# Patient Record
Sex: Female | Born: 1990 | Race: Black or African American | Hispanic: No | Marital: Single | Smoking: Never smoker
Health system: Southern US, Community
[De-identification: ages and names within clinical notes are randomized; demographics above are authoritative.]

## PROBLEM LIST (undated history)

## (undated) DIAGNOSIS — E611 Iron deficiency: Secondary | ICD-10-CM

## (undated) DIAGNOSIS — I10 Essential (primary) hypertension: Secondary | ICD-10-CM

## (undated) DIAGNOSIS — F419 Anxiety disorder, unspecified: Secondary | ICD-10-CM

## (undated) HISTORY — PX: WISDOM TOOTH EXTRACTION: SHX21

## (undated) HISTORY — PX: COSMETIC SURGERY: SHX468

---
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
## 2015-07-12 ENCOUNTER — Encounter (HOSPITAL_COMMUNITY): Payer: Self-pay

## 2015-07-12 ENCOUNTER — Emergency Department (HOSPITAL_COMMUNITY)
Admission: EM | Admit: 2015-07-12 | Discharge: 2015-07-12 | Disposition: A | Discharge status: Home / Self Care | Payer: Medicaid Other | Attending: Emergency Medicine | Admitting: Emergency Medicine

## 2015-07-12 DIAGNOSIS — F419 Anxiety disorder, unspecified: Secondary | ICD-10-CM | POA: Insufficient documentation

## 2015-07-12 DIAGNOSIS — T7840XA Allergy, unspecified, initial encounter: Secondary | ICD-10-CM | POA: Insufficient documentation

## 2015-07-12 DIAGNOSIS — X58XXXA Exposure to other specified factors, initial encounter: Secondary | ICD-10-CM | POA: Insufficient documentation

## 2015-07-12 DIAGNOSIS — Y999 Unspecified external cause status: Secondary | ICD-10-CM | POA: Insufficient documentation

## 2015-07-12 DIAGNOSIS — Y939 Activity, unspecified: Secondary | ICD-10-CM | POA: Diagnosis not present

## 2015-07-12 DIAGNOSIS — R0602 Shortness of breath: Secondary | ICD-10-CM | POA: Insufficient documentation

## 2015-07-12 DIAGNOSIS — Y929 Unspecified place or not applicable: Secondary | ICD-10-CM | POA: Insufficient documentation

## 2015-07-12 MED ORDER — EPINEPHRINE 0.3 MG/0.3ML IJ SOAJ: 0.3000 mg | Freq: Once | INTRAMUSCULAR | Status: DC

## 2015-07-12 MED ORDER — EPINEPHRINE 0.3 MG/0.3ML IJ SOAJ
0.3000 mg | Freq: Once | INTRAMUSCULAR | Status: AC
  Administered 2015-07-12: 0.3 mg via INTRAMUSCULAR
  Filled 2015-07-12: qty 0.3

## 2015-07-12 MED ORDER — PREDNISONE 50 MG PO TABS
60.0000 mg | ORAL_TABLET | Freq: Once | ORAL | Status: AC
  Administered 2015-07-12: 60 mg via ORAL
  Filled 2015-07-12 (×2): qty 1

## 2015-07-12 NOTE — ED Notes (Signed)
Patient has known shellfish allergy with airway swelling. Tonight while I work patient came into contact (touch only) with shellfish. Normally patient would use an EPI pen however she did not have one tonight. Pt c/o of difficulty breathing and generalized itching.

## 2015-07-12 NOTE — ED Provider Notes (Signed)
CSN: [id number]     Arrival date & time 07/12/15  1958 History  This chart was scribed for Benjiman Core, MD by Phillis Haggis, ED Scribe. This patient was seen in room [facility]08/[facility]08 and patient care was started at 8:16 PM.    Chief Complaint  Patient presents with  . Allergic Reaction   The history is provided by the patient. No language interpreter was used.   HPI Comments: Michele Richardson is a 24 y.o. Female with hx of shellfish allergies who presents to the Emergency Department complaining of generalized itching and difficulty breathing onset 3 hours ago. Pt states that she was at work when she came into contact with shellfish, which she has a known allergy to and typically causes airway swelling and rash. She states that her throat feels tight and her tongue feels itchy. Reports taking benadryl to no relief. Pt states that she usually has an EpiPen with her but did not have one on her.  Denies any significant medical problems. Denies sneezing, cough, color change, or any other symptoms.   No past medical history on file. No past surgical history on file. No family history on file. Social History  Substance Use Topics  . Smoking status: Never Smoker   . Smokeless tobacco: Not on file  . Alcohol Use: No   OB History    No data available     Review of Systems  HENT: Negative for sneezing.        Throat swelling  Respiratory: Positive for shortness of breath (difficulty breathing). Negative for cough.   Skin: Positive for rash. Negative for color change.  Allergic/Immunologic: Positive for food allergies (shellfish).   Allergies  Shellfish allergy and Sulfa antibiotics  Home Medications   Prior to Admission medications   Medication Sig Start Date End Date Taking? Authorizing Provider  diphenhydrAMINE (BENADRYL) 25 MG tablet Take 25 mg by mouth once as needed for allergies.   Yes Historical Provider, MD  EPINEPHrine 0.3 mg/0.3 mL IJ SOAJ injection Inject 0.3 mLs (0.3 mg  total) into the muscle once. 07/12/15   Benjiman Core, MD   BP 140/89 mmHg  Pulse 74  Temp(Src) 98.2 F (36.8 C) (Oral)  Resp 20  Ht  (1.702 m)  Wt 170 lb (77.111 kg)  BMI 26.62 kg/m2  SpO2 100%  LMP 07/01/2015  Physical Exam  Constitutional: She is oriented to person, place, and time. She appears well-developed and well-nourished.  Slightly anxious  HENT:  Head: Normocephalic and atraumatic.  Mild posterior pharyngeal edema; no lip swelling  Eyes: EOM are normal. Pupils are equal, round, and reactive to light.  Neck: Normal range of motion. Neck supple.  Cardiovascular: Normal rate, regular rhythm and normal heart sounds.   Pulmonary/Chest: Effort normal. No stridor. She has no wheezes.  Mildly harsh breath sounds  Abdominal: Soft. There is no tenderness.  Musculoskeletal: Normal range of motion.  No peripheral edema  Neurological: She is alert and oriented to person, place, and time.  Skin: Skin is warm and dry.  Psychiatric: She has a normal mood and affect. Her behavior is normal.  Nursing note and vitals reviewed.   ED Course  Procedures (including critical care time) COORDINATION OF CARE: 8:18 PM-Discussed treatment plan which includes oral steroids and EpiPen prescription with pt at bedside and pt agreed to plan.   No results found for this or any previous visit. No results found.   Labs Review Labs Reviewed - No data to display  Imaging Review  No results found.    EKG Interpretation None      MDM   Final diagnoses:  Allergic reaction, initial encounter    Patient with possible allergic reaction to shellfish. Feels better after treatment. Will discharge home with EpiPen. I personally performed the services described in this documentation, which was scribed in my presence. The recorded information has been reviewed and is accurate.     Benjiman Core, MD 07/13/15 2352

## 2015-07-12 NOTE — Discharge Instructions (Signed)

## 2016-12-14 DIAGNOSIS — Z79899 Other long term (current) drug therapy: Secondary | ICD-10-CM | POA: Insufficient documentation

## 2016-12-14 DIAGNOSIS — Z202 Contact with and (suspected) exposure to infections with a predominantly sexual mode of transmission: Secondary | ICD-10-CM | POA: Insufficient documentation

## 2016-12-15 ENCOUNTER — Emergency Department (HOSPITAL_COMMUNITY)
Admission: EM | Admit: 2016-12-15 | Discharge: 2016-12-15 | Disposition: A | Discharge status: Home / Self Care | Payer: Medicaid Other | Attending: Emergency Medicine | Admitting: Emergency Medicine

## 2016-12-15 ENCOUNTER — Encounter (HOSPITAL_COMMUNITY): Payer: Self-pay | Admitting: Emergency Medicine

## 2016-12-15 DIAGNOSIS — Z202 Contact with and (suspected) exposure to infections with a predominantly sexual mode of transmission: Secondary | ICD-10-CM

## 2016-12-15 MED ORDER — CEFTRIAXONE SODIUM 250 MG IJ SOLR
250.0000 mg | Freq: Once | INTRAMUSCULAR | Status: AC
  Administered 2016-12-15: 250 mg via INTRAMUSCULAR
  Filled 2016-12-15: qty 250

## 2016-12-15 MED ORDER — AZITHROMYCIN 250 MG PO TABS
1000.0000 mg | ORAL_TABLET | Freq: Once | ORAL | Status: AC
  Administered 2016-12-15: 1000 mg via ORAL
  Filled 2016-12-15: qty 4

## 2016-12-15 NOTE — Discharge Instructions (Signed)
Follow-up with the health dept if needed °

## 2016-12-15 NOTE — ED Triage Notes (Signed)
Pt states partner has yellow penile dc that started today. Pt states she has had no sx. Pt denies any vag dc, gu sx or abd pain. Denies any vaginal issues. Nad.

## 2016-12-15 NOTE — ED Provider Notes (Signed)
[facility]-EMERGENCY DEPT Provider Note   CSN: [id number] Arrival date & time: 12/14/16  2338     History   Chief Complaint Chief Complaint  Patient presents with  . Follow-up    HPI Michele Richardson is a 26 y.o. female.  HPI   Michele Richardson is a 26 y.o. female who presents to the Emergency Department requesting evaluation for possible STD.  She states her boyfriend noticed a yellow discharge from his penis two hours prior to arrival and he came to ER for evaluation and she decided to come here as well.  Patient denies any symptoms at present.  She reports last sexual encounter was several hours prior to arrival.   History reviewed. No pertinent past medical history.  There are no active problems to display for this patient.   History reviewed. No pertinent surgical history.  OB History    Gravida Para Term Preterm AB Living   1 1       1    SAB TAB Ectopic Multiple Live Births                   Home Medications    Prior to Admission medications   Medication Sig Start Date End Date Taking? Authorizing Provider  diphenhydrAMINE (BENADRYL) 25 MG tablet Take 25 mg by mouth once as needed for allergies.    Historical Provider, MD  EPINEPHrine 0.3 mg/0.3 mL IJ SOAJ injection Inject 0.3 mLs (0.3 mg total) into the muscle once. 07/12/15   Benjiman CoreNathan Pickering, MD    Family History History reviewed. No pertinent family history.  Social History Social History  Substance Use Topics  . Smoking status: Never Smoker  . Smokeless tobacco: Never Used  . Alcohol use Yes     Comment: every other day(one beer)     Allergies   Shellfish allergy and Sulfa antibiotics   Review of Systems Review of Systems  Constitutional: Negative for appetite change, chills and fever.  Respiratory: Negative for shortness of breath.   Cardiovascular: Negative for chest pain.  Gastrointestinal: Negative for abdominal pain, nausea and vomiting.  Genitourinary: Negative for decreased urine  volume, difficulty urinating, dysuria, frequency, genital sores, hematuria, vaginal bleeding, vaginal discharge and vaginal pain.  Musculoskeletal: Negative for back pain and myalgias.  Skin: Negative for rash.     Physical Exam Updated Vital Signs BP 142/85 (BP Location: Right Arm)   Pulse 70   Temp 98.2 F (36.8 C) (Oral)   Resp 17   Ht 5\' 7"  (1.702 m)   Wt 88.5 kg   LMP 11/22/2016   SpO2 100%   BMI 30.54 kg/m   Physical Exam  Constitutional: She is oriented to person, place, and time. She appears well-nourished. No distress.  HENT:  Head: Atraumatic.  Mouth/Throat: Oropharynx is clear and moist.  Neck: Normal range of motion.  Cardiovascular: Normal rate and regular rhythm.   No murmur heard. Pulmonary/Chest: Effort normal and breath sounds normal. No respiratory distress.  Abdominal: Soft. She exhibits no distension. There is no tenderness. There is no guarding.  Musculoskeletal: Normal range of motion.  Lymphadenopathy:    She has no cervical adenopathy.  Neurological: She is alert and oriented to person, place, and time.  Skin: Skin is warm. No rash noted.  Nursing note and vitals reviewed.    ED Treatments / Results  Labs (all labs ordered are listed, but only abnormal results are displayed) Labs Reviewed - No data to display  EKG  EKG Interpretation None  Radiology No results found.  Procedures Procedures (including critical care time)  Medications Ordered in ED Medications  cefTRIAXone (ROCEPHIN) injection 250 mg (not administered)  azithromycin (ZITHROMAX) tablet 1,000 mg (not administered)     Initial Impression / Assessment and Plan / ED Course  I have reviewed the triage vital signs and the nursing notes.  Pertinent labs & imaging results that were available during my care of the patient were reviewed by me and considered in my medical decision making (see chart for details).     Pelvic exam deferred, pt is asymptomatic and  sexual partner also here and seen by me. He has obvious penile discharge on exam and likely STD, so both patients will be treated.     Pt agrees to IM Rocephin and po zithromax. Counseled on safe sex practices.  Appears stable for d/c  Final Clinical Impressions(s) / ED Diagnoses   Final diagnoses:  STD exposure    New Prescriptions New Prescriptions   No medications on file     Pauline Aus, PA-C 12/15/16 1131    Devoria Albe, MD 12/15/16 2302

## 2017-02-12 ENCOUNTER — Emergency Department (HOSPITAL_COMMUNITY)
Admission: EM | Admit: 2017-02-12 | Discharge: 2017-02-12 | Disposition: A | Discharge status: Home / Self Care | Payer: Medicaid Other | Attending: Emergency Medicine | Admitting: Emergency Medicine

## 2017-02-12 ENCOUNTER — Emergency Department (HOSPITAL_COMMUNITY): Payer: Medicaid Other

## 2017-02-12 ENCOUNTER — Encounter (HOSPITAL_COMMUNITY): Payer: Self-pay | Admitting: Emergency Medicine

## 2017-02-12 DIAGNOSIS — R0602 Shortness of breath: Secondary | ICD-10-CM | POA: Diagnosis present

## 2017-02-12 DIAGNOSIS — J40 Bronchitis, not specified as acute or chronic: Secondary | ICD-10-CM | POA: Insufficient documentation

## 2017-02-12 MED ORDER — AZITHROMYCIN 250 MG PO TABS: ORAL_TABLET | ORAL | 0 refills | Status: DC

## 2017-02-12 MED ORDER — ALBUTEROL SULFATE HFA 108 (90 BASE) MCG/ACT IN AERS
2.0000 | INHALATION_SPRAY | RESPIRATORY_TRACT | Status: DC | PRN
  Administered 2017-02-12: 2 via RESPIRATORY_TRACT
  Filled 2017-02-12: qty 6.7

## 2017-02-12 MED ORDER — ALBUTEROL SULFATE (2.5 MG/3ML) 0.083% IN NEBU
2.5000 mg | INHALATION_SOLUTION | Freq: Once | RESPIRATORY_TRACT | Status: AC
  Administered 2017-02-12: 2.5 mg via RESPIRATORY_TRACT
  Filled 2017-02-12: qty 3

## 2017-02-12 MED ORDER — IPRATROPIUM-ALBUTEROL 0.5-2.5 (3) MG/3ML IN SOLN
3.0000 mL | Freq: Once | RESPIRATORY_TRACT | Status: AC
  Administered 2017-02-12: 3 mL via RESPIRATORY_TRACT
  Filled 2017-02-12: qty 3

## 2017-02-12 MED ORDER — AZITHROMYCIN 250 MG PO TABS: 250.0000 mg | ORAL_TABLET | Freq: Every day | ORAL | 0 refills | Status: DC

## 2017-02-12 MED ORDER — AZITHROMYCIN 250 MG PO TABS
500.0000 mg | ORAL_TABLET | Freq: Once | ORAL | Status: AC
  Administered 2017-02-12: 500 mg via ORAL
  Filled 2017-02-12: qty 2

## 2017-02-12 NOTE — ED Triage Notes (Signed)
SOB started yesterday, productive cough with green sputum. Denies CP, fever, N/V. States she feels SOB with exertion.

## 2017-02-12 NOTE — Discharge Instructions (Signed)
Drink plenty of fluids. Take Tylenol or Motrin for fever or aches. Follow-up if not improving

## 2017-02-12 NOTE — ED Provider Notes (Signed)
[facility]-EM[facility]Y [facility]PT Provider Note   CSN: [id number] Arrival date & time: 02/12/17  1953   By signing my name below, I, Bobbie Stack, attest that this documentation has been prepared under the direction and in the presence of Bethann Berkshire, MD. Electronically Signed: Bobbie Stack, Scribe. 02/12/17. 8:10 PM. History   Chief Complaint Chief Complaint  Patient presents with  . Shortness of Breath    HPI Comments: Michele Richardson is a 26 y.o. female who presents to the Emergency Department complaining of SOB since yesterday. The patient reports a productive cough with green sputum that began yesterday. The patient states that she has chest pain when taking deeps breaths but it only occurs while standing. She denies fever, chills, and swelling in legs. She doesn't report taking anything for her symptoms. She denies hx of smoking.  The history is provided by the patient. No language interpreter was used.  Shortness of Breath  This is a new problem. The average episode lasts 1 day. The problem occurs continuously.The current episode started yesterday. The problem has been gradually worsening. Associated symptoms include cough and sputum production. Pertinent negatives include no fever, no headaches, no vomiting, no abdominal pain, no rash and no leg swelling.    History reviewed. No pertinent past medical history.  There are no active problems to display for this patient.   History reviewed. No pertinent surgical history.  OB History    Gravida Para Term Preterm AB Living   1 1       1    SAB TAB Ectopic Multiple Live Births                   Home Medications    Prior to Admission medications   Medication Sig Start Date End Date Taking? Authorizing Provider  diphenhydrAMINE (BENADRYL) 25 MG tablet Take 25 mg by mouth once as needed for allergies.    Historical Provider, MD  EPINEPHrine 0.3 mg/0.3 mL IJ SOAJ injection Inject 0.3 mLs (0.3 mg total) into the muscle once.  07/12/15   Benjiman Core, MD    Family History History reviewed. No pertinent family history.  Social History Social History  Substance Use Topics  . Smoking status: Never Smoker  . Smokeless tobacco: Never Used  . Alcohol use Yes     Comment: every other day(one beer)     Allergies   Shellfish allergy and Sulfa antibiotics   Review of Systems Review of Systems  Constitutional: Negative for appetite change, fatigue and fever.  HENT: Negative for congestion, ear discharge and sinus pressure.   Eyes: Negative for discharge.  Respiratory: Positive for cough, sputum production and shortness of breath.   Cardiovascular: Negative for leg swelling.  Gastrointestinal: Negative for abdominal pain, diarrhea and vomiting.  Genitourinary: Negative for frequency and hematuria.  Musculoskeletal: Negative for back pain.  Skin: Negative for rash.  Neurological: Negative for seizures and headaches.  Psychiatric/Behavioral: Negative for hallucinations.  All other systems reviewed and are negative.    Physical Exam Updated Vital Signs Ht 5\' 7"  (1.702 m)   Wt 200 lb (90.7 kg)   LMP 02/11/2017   BMI 31.32 kg/m   Physical Exam  Constitutional: She is oriented to person, place, and time. She appears well-developed.  HENT:  Head: Normocephalic.  Eyes: Conjunctivae and EOM are normal. No scleral icterus.  Neck: Neck supple. No thyromegaly present.  Cardiovascular: Normal rate and regular rhythm.  Exam reveals no gallop and no friction rub.   No murmur heard. Pulmonary/Chest:  No stridor. She has wheezes. She has no rales. She exhibits no tenderness.  Minimal wheezing  Abdominal: She exhibits no distension. There is no tenderness. There is no rebound.  Musculoskeletal: Normal range of motion. She exhibits no edema.  Lymphadenopathy:    She has no cervical adenopathy.  Neurological: She is oriented to person, place, and time. She exhibits normal muscle tone. Coordination normal.    Skin: No rash noted. No erythema.  Psychiatric: She has a normal mood and affect. Her behavior is normal.  Nursing note and vitals reviewed.   ED Treatments / Results  DIAGNOSTIC STUDIES: Oxygen Saturation is 100% on RA, normal by my interpretation.    COORDINATION OF CARE: 8:03 PM Discussed treatment plan with pt at bedside and pt agreed to plan. I will check the patient's X-ray and labs.  Labs (all labs ordered are listed, but only abnormal results are displayed) Labs Reviewed - No data to display  EKG  EKG Interpretation None       Radiology No results found.  Procedures Procedures (including critical care time)  Medications Ordered in ED Medications  ipratropium-albuterol (DUONEB) 0.5-2.5 (3) MG/3ML nebulizer solution 3 mL (not administered)  albuterol (PROVENTIL) (2.5 MG/3ML) 0.083% nebulizer solution 2.5 mg (not administered)     Initial Impression / Assessment and Plan / ED Course  I have reviewed the triage vital signs and the nursing notes.  Pertinent labs & imaging results that were available during my care of the patient were reviewed by me and considered in my medical decision making (see chart for details).     Patient with bronchitis and bronchospasm. Patient will be treated with Zithromax and albuterol inhaler  Final Clinical Impressions(s) / ED Diagnoses   Final diagnoses:  None    New Prescriptions New Prescriptions   No medications on file   The chart was scribed for me under my direct supervision.  I personally performed the history, physical, and medical decision making and all procedures in the evaluation of this patient.Bethann Berkshire.    Marquon Alcala, MD 02/12/17 2216

## 2017-02-12 NOTE — ED Notes (Signed)
Pt transported to xray 

## 2017-09-12 ENCOUNTER — Encounter (HOSPITAL_COMMUNITY): Payer: Self-pay | Admitting: *Deleted

## 2017-09-12 ENCOUNTER — Emergency Department (HOSPITAL_COMMUNITY)
Admission: EM | Admit: 2017-09-12 | Discharge: 2017-09-12 | Disposition: A | Discharge status: Home / Self Care | Payer: Self-pay | Attending: Emergency Medicine | Admitting: Emergency Medicine

## 2017-09-12 DIAGNOSIS — Z79899 Other long term (current) drug therapy: Secondary | ICD-10-CM | POA: Insufficient documentation

## 2017-09-12 DIAGNOSIS — L0231 Cutaneous abscess of buttock: Secondary | ICD-10-CM | POA: Insufficient documentation

## 2017-09-12 DIAGNOSIS — L0291 Cutaneous abscess, unspecified: Secondary | ICD-10-CM

## 2017-09-12 MED ORDER — DOXYCYCLINE HYCLATE 100 MG PO TBEC: 100.0000 mg | DELAYED_RELEASE_TABLET | Freq: Two times a day (BID) | ORAL | 0 refills | Status: DC

## 2017-09-12 MED ORDER — DOXYCYCLINE HYCLATE 100 MG PO TABS
100.0000 mg | ORAL_TABLET | Freq: Once | ORAL | Status: AC
  Administered 2017-09-12: 100 mg via ORAL
  Filled 2017-09-12: qty 1

## 2017-09-12 NOTE — ED Triage Notes (Signed)
Pt c/o boil or possible spider bite to left buttock x 2 days. Pt reports slight fever yesterday.

## 2017-09-12 NOTE — ED Notes (Signed)
Pharmacy completed med rec.

## 2017-09-12 NOTE — Discharge Instructions (Signed)
Apply warm compresses as discussed to the site of infection.  You have been prescribed an antibiotic that should clear this infection.  Return here for any new or worsened symptoms. You may also consider the health clinic listed above for followup care .

## 2017-09-12 NOTE — ED Notes (Signed)
In addition to chief complaint, c/o pain to left breast at old scar.  C/o tenderness to touch and says scarring has increased.

## 2017-09-12 NOTE — ED Provider Notes (Signed)
[facility] EMERGENCY DEPARTMENT Provider Note   CSN: [id number] Arrival date & time: 09/12/17  1108     History   Chief Complaint Chief Complaint  Patient presents with  . Abscess    HPI Michele Richardson is a 26 y.o. female presenting with a 2-day history of abscess to her left buttock.  She describes a small papule that developed at the site and cannot rule out the possibility of a insect bite causing the lesion.  She has had increased pain and swelling with surrounding redness.  She has applied warm compresses without relief of symptoms.  There is been no drainage from the site.  She did have subjective fever yesterday but resolved today.  She has found no other alleviators for this complaint..  The history is provided by the patient.    History reviewed. No pertinent past medical history.  There are no active problems to display for this patient.   History reviewed. No pertinent surgical history.  OB History    Gravida Para Term Preterm AB Living   1 1       1    SAB TAB Ectopic Multiple Live Births                   Home Medications    Prior to Admission medications   Medication Sig Start Date End Date Taking? Authorizing Provider  diphenhydrAMINE (BENADRYL) 25 MG tablet Take 25 mg by mouth once as needed for allergies.   Yes [provider]  azithromycin (ZITHROMAX Z-PAK) 250 MG tablet Take one pill a day Patient not taking: Reported on 09/12/2017 02/12/17   Bethann Berkshire, MD  azithromycin (ZITHROMAX) 250 MG tablet Take 1 tablet (250 mg total) by mouth daily. Take first 2 tablets together, then 1 every day until finished. Patient not taking: Reported on 09/12/2017 02/12/17   Bethann Berkshire, MD  doxycycline (DORYX) 100 MG EC tablet Take 1 tablet (100 mg total) by mouth 2 (two) times daily. 09/12/17   Burgess Amor, PA-C  EPINEPHrine 0.3 mg/0.3 mL IJ SOAJ injection Inject 0.3 mLs (0.3 mg total) into the muscle once. 07/12/15   Benjiman Core, MD    metroNIDAZOLE (FLAGYL) 500 MG tablet Take 500 mg by mouth 3 (three) times daily.    [provider]    Family History No family history on file.  Social History Social History  Substance Use Topics  . Smoking status: Never Smoker  . Smokeless tobacco: Never Used  . Alcohol use Yes     Comment: every other day(one beer)     Allergies   Shellfish allergy and Sulfa antibiotics   Review of Systems Review of Systems  Constitutional: Positive for fever. Negative for chills.  Respiratory: Negative for shortness of breath and wheezing.   Gastrointestinal: Negative for nausea and vomiting.  Skin: Positive for color change and wound.  Neurological: Negative for numbness.     Physical Exam Updated Vital Signs BP (!) 144/93   Pulse 84   Temp 98 F (36.7 C) (Oral)   Resp 16   Ht 5\' 7"  (1.702 m)   Wt 96.3 kg (212 lb 3.2 oz)   LMP 08/07/2017   SpO2 100%   BMI 33.24 kg/m   Physical Exam  Constitutional: She is oriented to person, place, and time. She appears well-developed and well-nourished.  HENT:  Head: Normocephalic.  Cardiovascular: Normal rate.   Pulmonary/Chest: Effort normal.  Musculoskeletal: She exhibits tenderness.  Neurological: She is alert and oriented to person,  place, and time. No sensory deficit.  Skin: There is erythema.  1 cm area of induration left mid buttock with a 1 cm surrounding erythema.  There is no red streaking.  No drainage and no fluctuance.     ED Treatments / Results  Labs (all labs ordered are listed, but only abnormal results are displayed) Labs Reviewed - No data to display  EKG  EKG Interpretation None       Radiology No results found.  Informal bedside ultrasound performed and there was no fluctuance or abscess pocket found at the site.  Procedures Procedures (including critical care time)  Medications Ordered in ED Medications  doxycycline (VIBRA-TABS) tablet 100 mg (100 mg Oral Given 09/12/17 1352)      Initial Impression / Assessment and Plan / ED Course  I have reviewed the triage vital signs and the nursing notes.  Pertinent labs & imaging results that were available during my care of the patient were reviewed by me and considered in my medical decision making (see chart for details).     Patient was encouraged to continue warm water soaks and to avoid squeezing the site.  She was placed on doxycycline.  Strict return precautions were discussed with patient.  As needed follow-up anticipated.  Final Clinical Impressions(s) / ED Diagnoses   Final diagnoses:  Abscess    New Prescriptions Discharge Medication List as of 09/12/2017  1:26 PM       Burgess Amordol, Ancelmo Hunt, PA-C 09/14/17 2244    Bethann BerkshireZammit, Joseph, MD 09/15/17 1520

## 2019-10-22 ENCOUNTER — Emergency Department (HOSPITAL_COMMUNITY)
Admission: EM | Admit: 2019-10-22 | Discharge: 2019-10-22 | Disposition: A | Discharge status: Home / Self Care | Payer: Medicaid Other | Attending: Emergency Medicine | Admitting: Emergency Medicine

## 2019-10-22 ENCOUNTER — Emergency Department (HOSPITAL_COMMUNITY): Payer: Medicaid Other

## 2019-10-22 ENCOUNTER — Encounter (HOSPITAL_COMMUNITY): Payer: Self-pay | Admitting: Emergency Medicine

## 2019-10-22 ENCOUNTER — Other Ambulatory Visit: Payer: Self-pay

## 2019-10-22 DIAGNOSIS — R0789 Other chest pain: Secondary | ICD-10-CM | POA: Insufficient documentation

## 2019-10-22 DIAGNOSIS — R0602 Shortness of breath: Secondary | ICD-10-CM | POA: Insufficient documentation

## 2019-10-22 DIAGNOSIS — Z79899 Other long term (current) drug therapy: Secondary | ICD-10-CM | POA: Insufficient documentation

## 2019-10-22 LAB — COMPREHENSIVE METABOLIC PANEL
ALT: 12 U/L (ref 0–44)
AST: 14 U/L — ABNORMAL LOW (ref 15–41)
Albumin: 3.2 g/dL — ABNORMAL LOW (ref 3.5–5.0)
Alkaline Phosphatase: 86 U/L (ref 38–126)
Anion gap: 8 (ref 5–15)
BUN: 5 mg/dL — ABNORMAL LOW (ref 6–20)
CO2: 20 mmol/L — ABNORMAL LOW (ref 22–32)
Calcium: 9 mg/dL (ref 8.9–10.3)
Chloride: 105 mmol/L (ref 98–111)
Creatinine, Ser: 0.47 mg/dL (ref 0.44–1.00)
GFR calc Af Amer: >60 mL/min (ref >60)
GFR calc non Af Amer: >60 mL/min (ref >60)
Glucose, Bld: 111 mg/dL — ABNORMAL HIGH (ref 70–99)
Potassium: 3.5 mmol/L (ref 3.5–5.1)
Sodium: 133 mmol/L — ABNORMAL LOW (ref 135–145)
Total Bilirubin: 0.2 mg/dL — ABNORMAL LOW (ref 0.3–1.2)
Total Protein: 7.5 g/dL (ref 6.5–8.1)

## 2019-10-22 LAB — CBC WITH DIFFERENTIAL/PLATELET
Abs Immature Granulocytes: 0.12 10*3/uL — ABNORMAL HIGH (ref 0.00–0.07)
Basophils Absolute: 0 10*3/uL (ref 0.0–0.1)
Basophils Relative: 0 %
Eosinophils Absolute: 0.4 10*3/uL (ref 0.0–0.5)
Eosinophils Relative: 3 %
HCT: 30.4 % — ABNORMAL LOW (ref 36.0–46.0)
Hemoglobin: 10.3 g/dL — ABNORMAL LOW (ref 12.0–15.0)
Immature Granulocytes: 1 %
Lymphocytes Relative: 16 %
Lymphs Abs: 2.3 10*3/uL (ref 0.7–4.0)
MCH: 30.5 pg (ref 26.0–34.0)
MCHC: 33.9 g/dL (ref 30.0–36.0)
MCV: 89.9 fL (ref 80.0–100.0)
Monocytes Absolute: 1 10*3/uL (ref 0.1–1.0)
Monocytes Relative: 7 %
Neutro Abs: 10.8 10*3/uL — ABNORMAL HIGH (ref 1.7–7.7)
Neutrophils Relative %: 73 %
Platelets: 351 10*3/uL (ref 150–400)
RBC: 3.38 MIL/uL — ABNORMAL LOW (ref 3.87–5.11)
RDW: 13.3 % (ref 11.5–15.5)
WBC: 14.6 10*3/uL — ABNORMAL HIGH (ref 4.0–10.5)
nRBC: 0 % (ref 0.0–0.2)

## 2019-10-22 LAB — TROPONIN I (HIGH SENSITIVITY)
Troponin I (High Sensitivity): <2 ng/L (ref <18)
Troponin I (High Sensitivity): <2 ng/L (ref <18)

## 2019-10-22 NOTE — Discharge Instructions (Addendum)
As discussed, all of your labs and chest x-ray were normal today. Your chest pain could be related to reflux symptoms, you may want to ask your doctor if they recommend starting something for reflex. Please follow-up with your OBGYN if your symptoms continue for the next week. Your symptoms could all be related to pregnancy. Some women notice shortness of breath since the baby is pushing on the bottom portion of your lungs. Return to the ER if you develop worsening central chest pain that radiates down your left arm or jaw and is associated with severe shortness of breath. Return to the ER for any new or worsening symptoms.

## 2019-10-22 NOTE — ED Provider Notes (Signed)
[facility]ERGENCY DEPARTMENT Provider Note   CSN: [id number] Arrival date & time: 10/22/19  1444     History   Chief Complaint Chief Complaint  Patient presents with   Chest Pain    HPI Michele Richardson is a 28 y.o. female 5628 weeks gestation with no significant past medical history who presents to the ED for an evaluation of intermittent, central, non-radiating chest pain for the past few months.  Patient describes the pain as chest tightness and pressure.  She notes she has been experiencing this pain for the past few months, but has worsened over the past few days and became more consistent. Patient notes each episode lasts about 3 minutes. Pain typically occurs when lying flat or sitting in her chair. She has never experienced chest pain with exertion. Patient also notes chronic shortness of breath since the beginning of her pregnancy at rest and with exertion. Patient denies tobacco, alcohol, and drug use. Patient denies history of blood clots, recent surgeries, recent immobilizations, and hemoptysis. Patient denies recent illness. No family history of early CAD. Patient denies abdominal pain, lower extremity edema, fever, and chills. She denies recent illness.  Patient notes she is still able to feel her baby move. Denies vaginal bleeding.    History reviewed. No pertinent past medical history.  There are no active problems to display for this patient.   Past Surgical History:  Procedure Laterality Date   WISDOM TOOTH EXTRACTION       OB History    Gravida  3   Para  1   Term      Preterm      AB      Living  1     SAB      TAB      Ectopic      Multiple      Live Births               Home Medications    Prior to Admission medications   Medication Sig Start Date End Date Taking? Authorizing Provider  azithromycin (ZITHROMAX Z-PAK) 250 MG tablet Take one pill a day Patient not taking: Reported on 09/12/2017 02/12/17   Bethann BerkshireZammit, Joseph, MD    azithromycin (ZITHROMAX) 250 MG tablet Take 1 tablet (250 mg total) by mouth daily. Take first 2 tablets together, then 1 every day until finished. Patient not taking: Reported on 09/12/2017 02/12/17   Bethann BerkshireZammit, Joseph, MD  diphenhydrAMINE (BENADRYL) 25 MG tablet Take 25 mg by mouth once as needed for allergies.    [provider]  doxycycline (DORYX) 100 MG EC tablet Take 1 tablet (100 mg total) by mouth 2 (two) times daily. 09/12/17   Burgess AmorIdol, Julie, PA-C  EPINEPHrine 0.3 mg/0.3 mL IJ SOAJ injection Inject 0.3 mLs (0.3 mg total) into the muscle once. 07/12/15   Benjiman CorePickering, Nathan, MD  metroNIDAZOLE (FLAGYL) 500 MG tablet Take 500 mg by mouth 3 (three) times daily.    [provider]    Family History History reviewed. No pertinent family history.  Social History Social History   Tobacco Use   Smoking status: Never Smoker   Smokeless tobacco: Never Used  Substance Use Topics   Alcohol use: Not Currently    Comment: every other day(one beer)   Drug use: No     Allergies   Shellfish allergy, Eggs or egg-derived products, and Sulfa antibiotics   Review of Systems Review of Systems  Constitutional: Negative for chills and fever.  Respiratory: Positive  for shortness of breath. Negative for cough.   Cardiovascular: Positive for chest pain. Negative for palpitations and leg swelling.  Gastrointestinal: Negative for abdominal pain, diarrhea, nausea and vomiting.  Genitourinary: Negative for dysuria, pelvic pain, vaginal discharge and vaginal pain.  Musculoskeletal: Negative for back pain.  All other systems reviewed and are negative.    Physical Exam Updated Vital Signs BP 107/87    Pulse 88    Temp 98.6 F (37 C) (Oral)    Resp 15    Ht 5\' 7"  (1.702 m)    Wt 108.9 kg    SpO2 100%    BMI 37.59 kg/m   Physical Exam Vitals signs and nursing note reviewed.  Constitutional:      General: She is not in acute distress.    Appearance: She is not ill-appearing.   HENT:     Head: Normocephalic.  Neck:     Musculoskeletal: Neck supple.  Cardiovascular:     Rate and Rhythm: Normal rate and regular rhythm.     Pulses: Normal pulses.     Heart sounds: Normal heart sounds. No murmur. No friction rub. No gallop.   Pulmonary:     Effort: Pulmonary effort is normal.     Breath sounds: Normal breath sounds.     Comments: Respirations equal and unlabored, patient able to speak in full sentences, lungs clear to auscultation bilaterally Abdominal:     General: Abdomen is flat. There is no distension.     Palpations: Abdomen is soft.     Tenderness: There is no abdominal tenderness. There is no guarding or rebound.     Comments: Patient [redacted] weeks pregnant  Musculoskeletal:     Comments: No lower extremity edema. Negative Homans sign bilaterally. Distal sensation and pulses intact bilaterally.  Skin:    General: Skin is warm and dry.  Neurological:     General: No focal deficit present.     Mental Status: She is alert.      ED Treatments / Results  Labs (all labs ordered are listed, but only abnormal results are displayed) Labs Reviewed  CBC WITH DIFFERENTIAL/PLATELET - Abnormal; Notable for the following components:      Result Value   WBC 14.6 (*)    RBC 3.38 (*)    Hemoglobin 10.3 (*)    HCT 30.4 (*)    Neutro Abs 10.8 (*)    Abs Immature Granulocytes 0.12 (*)    All other components within normal limits  COMPREHENSIVE METABOLIC PANEL - Abnormal; Notable for the following components:   Sodium 133 (*)    CO2 20 (*)    Glucose, Bld 111 (*)    BUN 5 (*)    Albumin 3.2 (*)    AST 14 (*)    Total Bilirubin 0.2 (*)    All other components within normal limits  TROPONIN I (HIGH SENSITIVITY)  TROPONIN I (HIGH SENSITIVITY)    EKG EKG Interpretation  Date/Time:  Monday October 22 2019 18:50:49 EST Ventricular Rate:  88 PR Interval:    QRS Duration: 85 QT Interval:  374 QTC Calculation: 453 R Axis:   56 Text Interpretation: Sinus  rhythm Borderline T abnormalities, diffuse leads [id number] No significant change since last tracing Confirmed by 06-01-2002 Derwood Kaplan) on 10/22/2019 8:12:23 PM   Radiology Dg Chest 2 View  Result Date: 10/22/2019 CLINICAL DATA:  28 year old female is [redacted] weeks pregnant. Intermittent chest tightness. Shortness of breath on exertion since yesterday. EXAM: CHEST - 2 VIEW COMPARISON:  Chest radiographs 10/01/2018 and earlier. FINDINGS: Lung volumes and mediastinal contours remain normal. Both lungs appear clear. No pneumothorax or pleural effusion. Visualized tracheal air column is within normal limits. No acute osseous abnormality identified. Negative visible bowel gas pattern. IMPRESSION: Negative.  No acute cardiopulmonary abnormality. Electronically Signed   By: Genevie Ann M.D.   On: 10/22/2019 20:47    Procedures Procedures (including critical care time)  Medications Ordered in ED Medications - No data to display   Initial Impression / Assessment and Plan / ED Course  I have reviewed the triage vital signs and the nursing notes.  Pertinent labs & imaging results that were available during my care of the patient were reviewed by me and considered in my medical decision making (see chart for details).     HEAR Score: 41  28 year old female presents to the ED for evaluation of chest pain and shortness of breath which have been presents for a few months, but have worsened over the past few days. Patient is [redacted] weeks pregnant with no complications. Upon initial arrival, patient was tachycardic at 107, but remained in 80s-90s throughout her ED visit. Patient is not hypoxic or tachypneic. She is maintaining 99-100% O2 saturation on room air. Patient in no acute distress and non-ill appearing. Lungs clear to auscultation bilaterally. Patient speaking in full sentences with no accessory muscle usage. No lower extremity edema. Negative Homans sign bilaterally. Heart pathway score 2. Chest pain seems more  atypical. Given that it has been going on for a few months, low suspicion for ACS. Suspect chest pain and shortness of breath could be related to pregnancy or GERD. Low suspicion for PE/DVT. PERC negative and low risk with Well's criteria without the first set of vital signs. Suspect first tachycardia reading to be falsely elevated given her heart rate throughout ED stay. Will obtain routine labs, troponin, EKG, and CXR.  CXR personally reviewed which is negative for signs of infection. EKG reviewed which demonstrates sinus rhythm with no signs of ischemia. CBC with leukocytosis and mild anemia with hemoglobin at 10.3. CMP reassuring. Serial troponin negative. Low suspicion that chest pain is cardiac related. Advised patient to follow-up with her PCP/OBGYN within the next week if symptoms do not improve. Suggested patient to speak to OBGYN about starting a reflux medication. Strict ED precautions discussed with patient. Patient states understanding and agrees to plan. Patient discharged home in no acute distress and stable vitals.  Discussed case with Dr. Kathrynn Humble who agrees with assessment and plan.  Final Clinical Impressions(s) / ED Diagnoses   Final diagnoses:  Atypical chest pain  Shortness of breath    ED Discharge Orders    None       Jonette Eva, PA-C 10/23/19 [id number]    Varney Biles, MD 10/25/19 1002

## 2019-10-22 NOTE — ED Triage Notes (Signed)
PT states she is [redacted] weeks gestation and has had intermittent chest pain (tightness) periodically this pregnancy and muscle cramping. PT states center chest tightness and SOB with exertion since yesterday.

## 2019-11-29 DIAGNOSIS — O99013 Anemia complicating pregnancy, third trimester: Secondary | ICD-10-CM | POA: Insufficient documentation

## 2019-11-29 DIAGNOSIS — O24415 Gestational diabetes mellitus in pregnancy, controlled by oral hypoglycemic drugs: Secondary | ICD-10-CM | POA: Insufficient documentation

## 2020-08-06 ENCOUNTER — Emergency Department (HOSPITAL_COMMUNITY)
Admission: EM | Admit: 2020-08-06 | Discharge: 2020-08-06 | Discharge status: Against Medical Advice | Payer: Medicaid Other

## 2021-01-29 ENCOUNTER — Other Ambulatory Visit: Payer: Self-pay

## 2021-01-29 ENCOUNTER — Ambulatory Visit (INDEPENDENT_AMBULATORY_CARE_PROVIDER_SITE_OTHER): Payer: Medicaid Other

## 2021-01-29 ENCOUNTER — Ambulatory Visit (INDEPENDENT_AMBULATORY_CARE_PROVIDER_SITE_OTHER): Payer: Medicaid Other | Admitting: Podiatry

## 2021-01-29 ENCOUNTER — Encounter: Payer: Self-pay | Admitting: Podiatry

## 2021-01-29 DIAGNOSIS — S93409A Sprain of unspecified ligament of unspecified ankle, initial encounter: Secondary | ICD-10-CM

## 2021-01-29 DIAGNOSIS — M7662 Achilles tendinitis, left leg: Secondary | ICD-10-CM | POA: Diagnosis not present

## 2021-01-29 DIAGNOSIS — M76822 Posterior tibial tendinitis, left leg: Secondary | ICD-10-CM

## 2021-01-29 DIAGNOSIS — S93401D Sprain of unspecified ligament of right ankle, subsequent encounter: Secondary | ICD-10-CM | POA: Diagnosis not present

## 2021-01-29 MED ORDER — DEXAMETHASONE SODIUM PHOSPHATE 120 MG/30ML IJ SOLN
2.0000 mg | Freq: Once | INTRAMUSCULAR | Status: AC
  Administered 2021-01-29: 2 mg via INTRA_ARTICULAR

## 2021-01-29 MED ORDER — MELOXICAM 15 MG PO TABS: 15.0000 mg | ORAL_TABLET | Freq: Every day | ORAL | 3 refills | Status: DC

## 2021-01-29 MED ORDER — METHYLPREDNISOLONE 4 MG PO TBPK: ORAL_TABLET | ORAL | 0 refills | Status: DC

## 2021-01-29 NOTE — Progress Notes (Signed)
Subjective:  Patient ID: Michele Richardson, female    DOB: August 11, 1991,  MRN: [id number] HPI Chief Complaint  Patient presents with  . Ankle Pain    Anterior ankle right - twisted 2 weeks ago - tender  . Ankle Injury    Medial ankle left - twisted Aug 2021, ER xrayed-gave brace, still sore when walking a lot  . New Patient (Initial Visit)    30 y.o. female presents with the above complaint.   ROS: Denies fever chills nausea vomiting muscle aches pains calf pain back pain chest pain shortness of breath.  No past medical history on file. Past Surgical History:  Procedure Laterality Date  . WISDOM TOOTH EXTRACTION      Current Outpatient Medications:  .  meloxicam (MOBIC) 15 MG tablet, Take 1 tablet (15 mg total) by mouth daily., Disp: 30 tablet, Rfl: 3 .  methylPREDNISolone (MEDROL DOSEPAK) 4 MG TBPK tablet, 6 day dose pack - take as directed, Disp: 21 tablet, Rfl: 0  Allergies  Allergen Reactions  . Shellfish Allergy Anaphylaxis  . Eggs Or Egg-Derived Products   . Other   . Sulfa Antibiotics Rash   Review of Systems Objective:  There were no vitals filed for this visit.  General: Well developed, nourished, in no acute distress, alert and oriented x3   Dermatological: Skin is warm, dry and supple bilateral. Nails x 10 are well maintained; remaining integument appears unremarkable at this time. There are no open sores, no preulcerative lesions, no rash or signs of infection present.  Vascular: Dorsalis Pedis artery and Posterior Tibial artery pedal pulses are 2/4 bilateral with immedate capillary fill time. Pedal hair growth present. No varicosities and no lower extremity edema present bilateral.   Neruologic: Grossly intact via light touch bilateral. Vibratory intact via tuning fork bilateral. Protective threshold with Semmes Wienstein monofilament intact to all pedal sites bilateral. Patellar and Achilles deep tendon reflexes 2+ bilateral. No Babinski or clonus noted  bilateral.   Musculoskeletal: No gross boney pedal deformities bilateral. No pain, crepitus, or limitation noted with foot and ankle range of motion bilateral. Muscular strength 5/5 in all groups tested bilateral.  Right foot demonstrates no pain on range of motion of the ankle joint subtalar joint midfoot or forefoot.  No reproducible pain today.  She has minimal edema no erythema cellulitis drainage or odor.  She has no pain on palpation of the lateral ankles or the medial ankle tendons or ligaments.  Left ankle demonstrates pain with palpation of the posterior tibial tendon from the navicular tuberosity primarily beneath the medial malleolus extending proximally onto the muscle belly.  The worst part of it is just beneath the medial malleolus.  Gait: Unassisted, Nonantalgic.    Radiographs:  Radiographs taken today demonstrate an osseously mature individual with moderate pes planus.  No significant osseous abnormalities are identified.  No acute findings are noted.  Osseously mature individual.  Assessment & Plan:   Assessment: Ankle sprain bilaterally with posterior tibial tendinitis left.  Plan: Discussed placing her in a Tri-Lock brace left she states that she does not have insurance so she would like to try to find one herself rather than [place] providing 1 for her.  Also started her on a Medrol Dosepak to be followed by meloxicam.  I injected the posterior tibial tendon area making sure not to inject into the tendon itself only with 2 mg of dexamethasone and local anesthetic.  I would like to follow-up with her in about a month.  Her  son's name is Evon Slack T. [place], [place]

## 2021-03-12 ENCOUNTER — Ambulatory Visit: Payer: Medicaid Other | Admitting: Podiatry

## 2021-07-17 ENCOUNTER — Other Ambulatory Visit: Payer: Self-pay

## 2021-07-17 ENCOUNTER — Encounter (HOSPITAL_BASED_OUTPATIENT_CLINIC_OR_DEPARTMENT_OTHER): Payer: Self-pay

## 2021-07-17 DIAGNOSIS — Z20822 Contact with and (suspected) exposure to covid-19: Secondary | ICD-10-CM | POA: Diagnosis not present

## 2021-07-17 DIAGNOSIS — J209 Acute bronchitis, unspecified: Secondary | ICD-10-CM | POA: Diagnosis not present

## 2021-07-17 DIAGNOSIS — R0602 Shortness of breath: Secondary | ICD-10-CM | POA: Diagnosis present

## 2021-07-17 NOTE — ED Triage Notes (Signed)
Presents with sob cough and sore throat. Productive congested cough. Reports started 3 days ago.

## 2021-07-18 ENCOUNTER — Emergency Department (HOSPITAL_BASED_OUTPATIENT_CLINIC_OR_DEPARTMENT_OTHER): Payer: Medicaid Other

## 2021-07-18 ENCOUNTER — Emergency Department (HOSPITAL_BASED_OUTPATIENT_CLINIC_OR_DEPARTMENT_OTHER)
Admission: EM | Admit: 2021-07-18 | Discharge: 2021-07-18 | Disposition: A | Discharge status: Home / Self Care | Payer: Medicaid Other | Attending: Emergency Medicine | Admitting: Emergency Medicine

## 2021-07-18 DIAGNOSIS — R0602 Shortness of breath: Secondary | ICD-10-CM

## 2021-07-18 DIAGNOSIS — J209 Acute bronchitis, unspecified: Secondary | ICD-10-CM

## 2021-07-18 LAB — RESP PANEL BY RT-PCR (FLU A&B, COVID) ARPGX2
Influenza A by PCR: NEGATIVE
Influenza B by PCR: NEGATIVE
SARS Coronavirus 2 by RT PCR: NEGATIVE

## 2021-07-18 MED ORDER — ALBUTEROL SULFATE HFA 108 (90 BASE) MCG/ACT IN AERS
2.0000 | INHALATION_SPRAY | RESPIRATORY_TRACT | Status: DC | PRN
  Administered 2021-07-18: 2 via RESPIRATORY_TRACT
  Filled 2021-07-18: qty 6.7

## 2021-07-18 MED ORDER — AEROCHAMBER PLUS FLO-VU MISC
1.0000 | Freq: Once | Status: AC
  Administered 2021-07-18: 1
  Filled 2021-07-18: qty 1

## 2021-07-18 NOTE — ED Notes (Signed)
RT educated pt on proper use of MDI/aerochamber. Pt able to demonstrate understanding, able to perform w/out difficulty and teach back to RT proper use.

## 2021-07-18 NOTE — ED Notes (Signed)
Pt verbalizes understanding of discharge instructions. Opportunity for questioning and answers were provided. Armand removed by staff, pt discharged from ED to home. Educated to use inhaler has directed.

## 2021-07-18 NOTE — ED Provider Notes (Signed)
[facility]ERGENC[facility] Provider Note: Michele Dell, MD, FACEP  CSN: [id number] MRN: [id number] ARRIVAL: 07/17/21 at 2309 ROOM: DB012/DB012   CHIEF COMPLAINT  Shortness of Breath   HISTORY OF PRESENT ILLNESS  07/18/21 1:17 AM Michele Richardson is a 30 y.o. female with 3 days of cough and shortness of breath.  The illness began with a sore throat which has improved.  She has not had nasal congestion but has had sneezing.  Her shortness of breath is worse with exertion but is also present at rest.  It is moderate in severity.  She thinks she has been wheezing at night when lying supine.  She has been taking generic Mucinex without adequate relief.   History reviewed. No pertinent past medical history.  Past Surgical History:  Procedure Laterality Date   WISDOM TOOTH EXTRACTION      No family history on file.  Social History   Tobacco Use   Smoking status: Never   Smokeless tobacco: Never  Vaping Use   Vaping Use: Never used  Substance Use Topics   Alcohol use: Yes    Comment: every other day(one beer)   Drug use: No    Prior to Admission medications   Medication Sig Start Date End Date Taking? Authorizing Provider  meloxicam (MOBIC) 15 MG tablet Take 1 tablet (15 mg total) by mouth daily. 01/29/21   Hyatt, Max T, DPM  methylPREDNISolone (MEDROL DOSEPAK) 4 MG TBPK tablet 6 day dose pack - take as directed 01/29/21   Hyatt, Max T, DPM    Allergies Shellfish allergy, Eggs or egg-derived products, Other, and Sulfa antibiotics   REVIEW OF SYSTEMS  Negative except as noted here or in the History of Present Illness.   PHYSICAL EXAMINATION  Initial Vital Signs Blood pressure (!) 160/106, pulse 80, temperature 98.4 F (36.9 C), temperature source Oral, resp. rate 16, height 5\' 7"  (1.702 m), weight 111.1 kg, SpO2 100 %, unknown if currently breastfeeding.  Examination General: Well-developed, well-nourished female in no acute distress; appearance consistent with age of  record HENT: normocephalic; atraumatic; no pharyngeal erythema or exudate Eyes: pupils equal, round and reactive to light; extraocular muscles intact Neck: supple Heart: regular rate and rhythm Lungs: Decreased air movement without wheezing Abdomen: soft; nondistended; nontender; bowel sounds present Extremities: No deformity; full range of motion; pulses normal Neurologic: Awake, alert and oriented; motor function intact in all extremities and symmetric; no facial droop Skin: Warm and dry Psychiatric: Normal mood and affect   RESULTS  Summary of this visit's results, reviewed and interpreted by myself:   EKG Interpretation  Date/Time:    Ventricular Rate:    PR Interval:    QRS Duration:   QT Interval:    QTC Calculation:   R Axis:     Text Interpretation:         Laboratory Studies: Results for orders placed or performed during the hospital encounter of 07/18/21 (from the past 24 hour(s))  Resp Panel by RT-PCR (Flu A&B, Covid)     Status: None   Collection Time: 07/17/21 11:11 PM  Result Value Ref Range   SARS Coronavirus 2 by RT PCR NEGATIVE NEGATIVE   Influenza A by PCR NEGATIVE NEGATIVE   Influenza B by PCR NEGATIVE NEGATIVE   Imaging Studies: DG Chest Port 1 View  Result Date: 07/18/2021 CLINICAL DATA:  Shortness of breath EXAM: PORTABLE CHEST 1 VIEW COMPARISON:  10/22/2019 FINDINGS: Lungs are clear.  No pleural effusion or pneumothorax. The heart is normal in  size. IMPRESSION: No evidence of acute cardiopulmonary disease. Electronically Signed   By: Charline Bills M.D.   On: 07/18/2021 00:29    ED COURSE and MDM  Nursing notes, initial and subsequent vitals signs, including pulse oximetry, reviewed and interpreted by myself.  Vitals:   07/17/21 2314 07/18/21 0006 07/18/21 0007 07/18/21 0141  BP: (!) 175/124 (!) 160/106 (!) 160/106 (!) 164/94  Pulse: 86  80 79  Resp: 20  16 18   Temp: 98.4 F (36.9 C)     TempSrc: Oral     SpO2: 100%  100% 100%  Weight:       Height:       Medications  albuterol (VENTOLIN HFA) 108 (90 Base) MCG/ACT inhaler 2 puff (2 puffs Inhalation Given 07/18/21 0141)  aerochamber plus with mask device 1 each (1 each Other Given 07/18/21 0140)   1:51 AM Air movement improved after albuterol treatment.  Patient given albuterol inhaler and AeroChamber and instructed in their use.  We will of systemic steroids at this time as patient has a history of gestational diabetes and thus at risk for hyperglycemia.   PROCEDURES  Procedures   ED DIAGNOSES     ICD-10-CM   1. Acute bronchitis with bronchospasm  J20.9     2. Shortness of breath  R06.02 DG Chest [facility] Chest [place] [address]         [place], [place], [place] 07/18/21 [id number]

## 2021-09-16 ENCOUNTER — Encounter (HOSPITAL_BASED_OUTPATIENT_CLINIC_OR_DEPARTMENT_OTHER): Payer: Self-pay | Admitting: Obstetrics and Gynecology

## 2021-09-16 ENCOUNTER — Emergency Department (HOSPITAL_BASED_OUTPATIENT_CLINIC_OR_DEPARTMENT_OTHER)
Admission: EM | Admit: 2021-09-16 | Discharge: 2021-09-16 | Disposition: A | Discharge status: Home / Self Care | Payer: Medicaid Other | Attending: Emergency Medicine | Admitting: Emergency Medicine

## 2021-09-16 ENCOUNTER — Other Ambulatory Visit: Payer: Self-pay

## 2021-09-16 ENCOUNTER — Emergency Department (HOSPITAL_BASED_OUTPATIENT_CLINIC_OR_DEPARTMENT_OTHER): Payer: Medicaid Other | Admitting: Radiology

## 2021-09-16 DIAGNOSIS — F419 Anxiety disorder, unspecified: Secondary | ICD-10-CM | POA: Insufficient documentation

## 2021-09-16 DIAGNOSIS — R000 Tachycardia, unspecified: Secondary | ICD-10-CM | POA: Diagnosis present

## 2021-09-16 LAB — CBC
HCT: 36.5 % (ref 36.0–46.0)
Hemoglobin: 12.1 g/dL (ref 12.0–15.0)
MCH: 27.5 pg (ref 26.0–34.0)
MCHC: 33.2 g/dL (ref 30.0–36.0)
MCV: 83 fL (ref 80.0–100.0)
Platelets: 354 10*3/uL (ref 150–400)
RBC: 4.4 MIL/uL (ref 3.87–5.11)
RDW: 14.6 % (ref 11.5–15.5)
WBC: 11.5 10*3/uL — ABNORMAL HIGH (ref 4.0–10.5)
nRBC: 0 % (ref 0.0–0.2)

## 2021-09-16 LAB — BASIC METABOLIC PANEL
Anion gap: 10 (ref 5–15)
BUN: 7 mg/dL (ref 6–20)
CO2: 26 mmol/L (ref 22–32)
Calcium: 9.2 mg/dL (ref 8.9–10.3)
Chloride: 104 mmol/L (ref 98–111)
Creatinine, Ser: 0.82 mg/dL (ref 0.44–1.00)
GFR, Estimated: >60 mL/min (ref >60)
Glucose, Bld: 98 mg/dL (ref 70–99)
Potassium: 3.3 mmol/L — ABNORMAL LOW (ref 3.5–5.1)
Sodium: 140 mmol/L (ref 135–145)

## 2021-09-16 LAB — PREGNANCY, URINE: Preg Test, Ur: NEGATIVE

## 2021-09-16 LAB — TROPONIN I (HIGH SENSITIVITY): Troponin I (High Sensitivity): <2 ng/L (ref <18)

## 2021-09-16 MED ORDER — POTASSIUM CHLORIDE CRYS ER 20 MEQ PO TBCR
40.0000 meq | EXTENDED_RELEASE_TABLET | Freq: Once | ORAL | Status: AC
  Administered 2021-09-16: 40 meq via ORAL
  Filled 2021-09-16: qty 2

## 2021-09-16 MED ORDER — HYDROXYZINE HCL 25 MG PO TABS: 25.0000 mg | ORAL_TABLET | Freq: Four times a day (QID) | ORAL | 0 refills | Status: DC

## 2021-09-16 NOTE — ED Provider Notes (Signed)
[facility] EMERGENCY DEPT Provider Note   CSN: [id number] Arrival date & time: 09/16/21  1240     History Chief Complaint  Patient presents with   Tachycardia    Michele Richardson is a 30 y.o. female.  HPI   Pt is a 30 y/o female who presents to the ED today for eval of anxiety. States that she has been having panic attacks for months and they have worsened over the last week.  She was seen by her OB/GYN who initially had diagnosed her with postpartum depression after she delivered her baby in February of this year.  She followed up recently and was started on fluoxetine and lisinopril.  She has been taking these medications however she feels that every morning she wakes up at 6 AM and is panicking.  When she has a panic attack she has palpitations and this morning she also experienced chest pain.  She no longer have any chest pain but she is still feeling extremely anxious and is hoping for some relief.  History reviewed. No pertinent past medical history.  Patient Active Problem List   Diagnosis Date Noted   Anemia affecting pregnancy in third trimester 11/29/2019   Gestational diabetes mellitus (GDM) in third trimester controlled on oral hypoglycemic drug 11/29/2019    Past Surgical History:  Procedure Laterality Date   WISDOM TOOTH EXTRACTION       OB History     Gravida  3   Para  1   Term      Preterm      AB      Living  1      SAB      IAB      Ectopic      Multiple      Live Births              No family history on file.  Social History   Tobacco Use   Smoking status: Never   Smokeless tobacco: Never  Vaping Use   Vaping Use: Never used  Substance Use Topics   Alcohol use: Yes    Comment: every other day(one beer)   Drug use: No    Home Medications Prior to Admission medications   Medication Sig Start Date End Date Taking? Authorizing Provider  FLUoxetine (PROZAC) 20 MG tablet Take 20 mg by mouth daily.   Yes  [provider]  lisinopril (ZESTRIL) 10 MG tablet Take 10 mg by mouth 2 (two) times daily.   Yes [provider]  meloxicam (MOBIC) 15 MG tablet Take 1 tablet (15 mg total) by mouth daily. 01/29/21   Hyatt, Max T, DPM    Allergies    Shellfish allergy, Eggs or egg-derived products, Other, and Sulfa antibiotics  Review of Systems   Review of Systems  Constitutional:  Negative for fever.  HENT:  Negative for ear pain and sore throat.   Eyes:  Negative for visual disturbance.  Respiratory:  Positive for shortness of breath. Negative for cough.   Cardiovascular:  Positive for chest pain and palpitations.  Gastrointestinal:  Negative for abdominal pain, constipation, diarrhea, nausea and vomiting.  Genitourinary:  Negative for dysuria and hematuria.  Musculoskeletal:  Negative for back pain.  Skin:  Negative for color change and rash.  Neurological:  Negative for seizures and headaches.  All other systems reviewed and are negative.  Physical Exam Updated Vital Signs BP (!) 176/105 (BP Location: Right Arm)   Pulse (!) 52   Temp  98.3 F (36.8 C)   Resp 15   LMP 09/13/2021 (Exact Date)   SpO2 100%   Breastfeeding No   Physical Exam Vitals and nursing note reviewed.  Constitutional:      General: She is not in acute distress.    Appearance: She is well-developed.  HENT:     Head: Normocephalic and atraumatic.  Eyes:     Conjunctiva/sclera: Conjunctivae normal.  Cardiovascular:     Rate and Rhythm: Normal rate and regular rhythm.     Heart sounds: Normal heart sounds. No murmur heard. Pulmonary:     Effort: Pulmonary effort is normal. No respiratory distress.     Breath sounds: Normal breath sounds. No wheezing, rhonchi or rales.  Abdominal:     General: Bowel sounds are normal.     Palpations: Abdomen is soft.     Tenderness: There is no abdominal tenderness. There is no guarding or rebound.  Musculoskeletal:     Cervical back: Neck supple.  Skin:     General: Skin is warm and dry.  Neurological:     Mental Status: She is alert.  Psychiatric:        Attention and Perception: Attention normal.        Mood and Affect: Mood is anxious.        Speech: Speech normal.        Behavior: Behavior normal.        Thought Content: Thought content normal.    ED Results / Procedures / Treatments   Labs (all labs ordered are listed, but only abnormal results are displayed) Labs Reviewed  BASIC METABOLIC PANEL - Abnormal; Notable for the following components:      Result Value   Potassium 3.3 (*)    All other components within normal limits  CBC - Abnormal; Notable for the following components:   WBC 11.5 (*)    All other components within normal limits  PREGNANCY, URINE  TROPONIN I (HIGH SENSITIVITY)    EKG EKG Interpretation  Date/Time:  Wednesday September 16 2021 12:57:52 EDT Ventricular Rate:  80 PR Interval:  132 QRS Duration: 84 QT Interval:  368 QTC Calculation: 424 R Axis:   78 Text Interpretation: Normal sinus rhythm T wave abnormality, consider inferolateral ischemia T wave inversion similar to Nov 2020 Confirmed by Pricilla Loveless [phone]) on 09/16/2021 2:30:24 PM  Radiology DG Chest 2 View  Result Date: 09/16/2021 CLINICAL DATA:  Chest pain EXAM: CHEST - 2 VIEW COMPARISON:  07/18/2021 FINDINGS: The heart size and mediastinal contours are within normal limits. There is interval decrease in transverse diameter of heart. Both lungs are clear. The visualized skeletal structures are unremarkable. IMPRESSION: No active cardiopulmonary disease. Reading location: [place], [place]. Electronically Signed   By: Ernie Avena M.D.   On: 09/16/2021 13:47    Procedures Procedures   Medications Ordered in ED Medications  potassium chloride SA (KLOR-CON) CR tablet 40 mEq (has no administration in time range)    ED Course  I have reviewed the triage vital signs and the nursing notes.  Pertinent labs & imaging results that  were available during my care of the patient were reviewed by me and considered in my medical decision making (see chart for details).    MDM Rules/Calculators/A&P                          30 year old female presents the emergency department today for evaluation of palpitations and chest pain.  Reports increased anxiety recently.  Recently started on fluoxetine about a week ago but symptoms were occurring before this.  Reviewed/interpreted labs  CBC with mild leukocytosis, no anemia BMP with mild hypokalemia, likely from decreased po intake.  Urine preg neg  EKG - Normal sinus rhythm T wave abnormality, consider inferolateral ischemia T wave inversion similar to Nov 2020  Reviewed/interpreted imaging CXR - No active cardiopulmonary disease.   Discussed plan to DC patient with hydroxyzine as I suspect her symptoms are likely secondary to anxiety.  Advised her to continue her fluoxetine at home.  She is also given information to follow-up with her PCP in regards to symptoms.  We discussed that she may need titration of her blood pressure medications as well.  No evidence of hypertensive emergency on my evaluation today and I do not think that her symptoms are related to her blood pressure at all.  She voices understanding of the plan and reasons to return.  All questions answered.  Patient stable for discharge.   Final Clinical Impression(s) / ED Diagnoses Final diagnoses:  Anxiety    Rx / DC Orders ED Discharge Orders     None        Rayne Du 09/16/21 Sherryl Manges, MD 09/17/21 [phone]

## 2021-09-16 NOTE — Discharge Instructions (Addendum)
Prescription given for hydroxyzine. Take medication as directed and do not operate machinery, drive a car, or work while taking this medication as it can make you drowsy.   Take one tablet (25mg ) tonight. If symptoms are not improved you can take two tablets (50 mg) tomorrow night  Please follow up with your primary care provider within 5-7 days for re-evaluation of your symptoms. If you do not have a primary care provider, information for a healthcare clinic has been provided for you to make arrangements for follow up care. Please return to the emergency department for any new or worsening symptoms.

## 2021-09-16 NOTE — ED Triage Notes (Signed)
Patient reports to the ER for feeling like she is having a panic attack and anxiety. Patient reports she started feeling panicked at 0600 this morning and she feels like she is having tachycardia.

## 2022-02-28 ENCOUNTER — Encounter (HOSPITAL_BASED_OUTPATIENT_CLINIC_OR_DEPARTMENT_OTHER): Payer: Self-pay

## 2022-02-28 ENCOUNTER — Other Ambulatory Visit: Payer: Self-pay

## 2022-02-28 DIAGNOSIS — N61 Mastitis without abscess: Secondary | ICD-10-CM | POA: Insufficient documentation

## 2022-02-28 DIAGNOSIS — N644 Mastodynia: Secondary | ICD-10-CM | POA: Diagnosis present

## 2022-02-28 NOTE — ED Triage Notes (Signed)
Pt arrives with c/o right sided breast pain. Pt had breast reduction surgery in January and 4 days ago she noticed red ring around her nipple. Per pt, the pain has gotten worse and it is firm to the touch now.  ?

## 2022-03-01 ENCOUNTER — Emergency Department (HOSPITAL_BASED_OUTPATIENT_CLINIC_OR_DEPARTMENT_OTHER)
Admission: EM | Admit: 2022-03-01 | Discharge: 2022-03-01 | Disposition: A | Discharge status: Home / Self Care | Payer: Medicaid Other | Attending: Emergency Medicine | Admitting: Emergency Medicine

## 2022-03-01 DIAGNOSIS — N61 Mastitis without abscess: Secondary | ICD-10-CM

## 2022-03-01 MED ORDER — DOXYCYCLINE HYCLATE 100 MG PO CAPS: 100.0000 mg | ORAL_CAPSULE | Freq: Two times a day (BID) | ORAL | 0 refills | Status: DC

## 2022-03-01 NOTE — ED Provider Notes (Signed)
?[facility] EM[facility]PT ?Provider Note ? ? ?CSN: [id number] ?Arrival date & time: 02/28/22  2259 ? ?  ? ?History ? ?Chief Complaint  ?Patient presents with  ? Breast Pain  ? ? ?Michele Richardson is a 31 y.o. female. ? ?HPI ? ?  ? ?This is a 31 year old female who presents with redness, pain, swelling of the right breast.  Patient reports that she had a breast reduction in January.  She had been doing fine.  Over the last 3 to 4 days she has noticed redness, and swelling of the right breast.  She attempted to contact her plastic surgeon but has been unsuccessful.  He has not noted any nipple discharge.  Denies systemic symptoms including fevers. ? ? ?Home Medications ?Prior to Admission medications   ?Medication Sig Start Date End Date Taking? Authorizing Provider  ?doxycycline (VIBRAMYCIN) 100 MG capsule Take 1 capsule (100 mg total) by mouth 2 (two) times daily. 03/01/22  Yes Denette Hass, Mayer Masker, MD  ?FLUoxetine (PROZAC) 20 MG tablet Take 20 mg by mouth daily.    [provider]  ?hydrOXYzine (ATARAX/VISTARIL) 25 MG tablet Take 1 tablet (25 mg total) by mouth every 6 (six) hours. 09/16/21   Couture, Cortni S, PA-C  ?lisinopril (ZESTRIL) 10 MG tablet Take 10 mg by mouth 2 (two) times daily.    [provider]  ?meloxicam (MOBIC) 15 MG tablet Take 1 tablet (15 mg total) by mouth daily. 01/29/21   Hyatt, Max T, DPM  ?   ? ?Allergies    ?Shellfish allergy, Eggs or egg-derived products, Other, and Sulfa antibiotics   ? ?Review of Systems   ?Review of Systems  ?Constitutional:  Negative for fever.  ?Skin:  Positive for color change.  ?All other systems reviewed and are negative. ? ?Physical Exam ?Updated Vital Signs ?BP 139/90 (BP Location: Left Arm)   Pulse 85   Temp 98.8 ?F (37.1 ?C)   Resp 16   Ht 1.702 m (5\' 7" )   LMP 02/28/2022   SpO2 98%   BMI 38.37 kg/m?  ?Physical Exam ?Vitals and nursing note reviewed.  ?Constitutional:   ?   Appearance: She is well-developed. She is obese.  She is not ill-appearing.  ?HENT:  ?   Head: Normocephalic and atraumatic.  ?   Nose: Nose normal.  ?   Mouth/Throat:  ?   Mouth: Mucous membranes are moist.  ?Eyes:  ?   Pupils: Pupils are equal, round, and reactive to light.  ?Cardiovascular:  ?   Rate and Rhythm: Normal rate and regular rhythm.  ?Pulmonary:  ?   Effort: Pulmonary effort is normal. No respiratory distress.  ?Chest:  ?Breasts: ?   Right: Swelling, skin change and tenderness present. No nipple discharge.  ? ? ?   Comments: Patient with slight erythema circumferentially around the right areola, there is tenderness and fluctuance just underneath the nipple in the upper outer quadrant ?Abdominal:  ?   Palpations: Abdomen is soft.  ?Musculoskeletal:  ?   Cervical back: Neck supple.  ?Skin: ?   General: Skin is warm and dry.  ?Neurological:  ?   Mental Status: She is alert and oriented to person, place, and time.  ?Psychiatric:     ?   Mood and Affect: Mood normal.  ? ? ?ED Results / Procedures / Treatments   ?Labs ?(all labs ordered are listed, but only abnormal results are displayed) ?Labs Reviewed - No data to display ? ?EKG ?None ? ?Radiology ?No results  found. ? ?Procedures ?Procedures  ? ? ?Medications Ordered in ED ?Medications - No data to display ? ?ED Course/ Medical Decision Making/ A&P ?  ?                        ?Medical Decision Making ?Risk ?Prescription drug management. ? ? ?This patient presents to the ED for concern of breast pain and swelling, this involves an extensive number of treatment options, and is a complaint that carries with it a high risk of complications and morbidity.  The differential diagnosis includes abscess, cellulitis, surgical complication ? ?MDM:   ? ?This is a patient who presents with skin changes and pain to the right breast.  She is nontoxic and afebrile.  She has tenderness palpation of the right breast at the nipple.  There is some fluctuance underneath the nipple which could be an abscess.  There is no  nipple discharge.  Exam is concerning potential cellulitis plus or minus abscess.  She will likely need imaging.  Do not have access to ultrasound at this time.  Regardless, she needs an exam by her primary surgeon.  We will start her on doxycycline as she has a sulfa allergy.  Recommend she contact her primary surgeon for exam and imaging recommendations.  Patient stated understanding.  She was given strict return precautions. ?(Labs, imaging) ? ?Labs: ?I Ordered, and personally interpreted labs.  The pertinent results include: None ? ?Imaging Studies ordered: ?I ordered imaging studies including none ?I independently visualized and interpreted imaging. ?I agree with the radiologist interpretation ? ?Additional history obtained from chart review.  External records from outside source obtained and reviewed including outside plastic surgery visits ? ?Critical Interventions: ?N/A ? ?Consultations: ?I requested consultation with the NA,  and discussed lab and imaging findings as well as pertinent plan - they recommend: N/A ? ?Cardiac Monitoring: ?The patient was maintained on a cardiac monitor.  I personally viewed and interpreted the cardiac monitored which showed an underlying rhythm of: Sinus rhythm ? ?Reevaluation: ?After the interventions noted above, I reevaluated the patient and found that they have :stayed the same ? ? ?Considered admission for: N/A ? ?Social Determinants of Health: ?Lives independently ? ?Disposition: Discharge ? ?Co morbidities that complicate the patient evaluation ?History reviewed. No pertinent past medical history.  ? ?Medicines ?Meds ordered this encounter  ?Medications  ? doxycycline (VIBRAMYCIN) 100 MG capsule  ?  Sig: Take 1 capsule (100 mg total) by mouth 2 (two) times daily.  ?  Dispense:  20 capsule  ?  Refill:  0  ?  ?I have reviewed the patients home medicines and have made adjustments as needed ? ?Problem List / ED Course: ?Problem List Items Addressed This Visit   ?None ?Visit  Diagnoses   ? ? Cellulitis of female breast    -  Primary  ? ?  ?  ? ? ? ? ? ? ? ? ? ? ? ? ?Final Clinical Impression(s) / ED Diagnoses ?Final diagnoses:  ?Cellulitis of female breast  ? ? ?Rx / DC Orders ?ED Discharge Orders   ? ?      Ordered  ?  doxycycline (VIBRAMYCIN) 100 MG capsule  2 times daily       ? 03/01/22 0144  ? ?  ?  ? ?  ? ? ?  ?Shon Baton, MD ?03/01/22 0150 ? ?

## 2022-03-01 NOTE — Discharge Instructions (Signed)
You were seen today with concerning symptoms related to your breast.  You may have an early infection and/or an abscess.  It is very important that you follow-up with your surgeon for exam and recommendations regarding imaging.  Start antibiotics.  If you develop fevers or worsening redness, you should be reevaluated. ?

## 2022-03-04 ENCOUNTER — Other Ambulatory Visit: Payer: Self-pay

## 2022-03-04 ENCOUNTER — Encounter (HOSPITAL_COMMUNITY): Payer: Self-pay

## 2022-03-04 ENCOUNTER — Emergency Department (HOSPITAL_COMMUNITY): Payer: Medicaid Other

## 2022-03-04 ENCOUNTER — Emergency Department (HOSPITAL_COMMUNITY)
Admission: EM | Admit: 2022-03-04 | Discharge: 2022-03-04 | Discharge status: Against Medical Advice | Payer: Medicaid Other | Attending: Emergency Medicine | Admitting: Emergency Medicine

## 2022-03-04 DIAGNOSIS — N644 Mastodynia: Secondary | ICD-10-CM | POA: Insufficient documentation

## 2022-03-04 DIAGNOSIS — Z5321 Procedure and treatment not carried out due to patient leaving prior to being seen by health care provider: Secondary | ICD-10-CM | POA: Diagnosis not present

## 2022-03-04 DIAGNOSIS — R509 Fever, unspecified: Secondary | ICD-10-CM | POA: Insufficient documentation

## 2022-03-04 LAB — COMPREHENSIVE METABOLIC PANEL
ALT: 17 U/L (ref 0–44)
AST: 18 U/L (ref 15–41)
Albumin: 3.8 g/dL (ref 3.5–5.0)
Alkaline Phosphatase: 78 U/L (ref 38–126)
Anion gap: 9 (ref 5–15)
BUN: 8 mg/dL (ref 6–20)
CO2: 27 mmol/L (ref 22–32)
Calcium: 9.1 mg/dL (ref 8.9–10.3)
Chloride: 99 mmol/L (ref 98–111)
Creatinine, Ser: 0.78 mg/dL (ref 0.44–1.00)
GFR, Estimated: >60 mL/min (ref >60)
Glucose, Bld: 99 mg/dL (ref 70–99)
Potassium: 3.4 mmol/L — ABNORMAL LOW (ref 3.5–5.1)
Sodium: 135 mmol/L (ref 135–145)
Total Bilirubin: 0.6 mg/dL (ref 0.3–1.2)
Total Protein: 7.9 g/dL (ref 6.5–8.1)

## 2022-03-04 LAB — CBC WITH DIFFERENTIAL/PLATELET
Abs Immature Granulocytes: 0.06 10*3/uL (ref 0.00–0.07)
Basophils Absolute: 0.1 10*3/uL (ref 0.0–0.1)
Basophils Relative: 0 %
Eosinophils Absolute: 0.4 10*3/uL (ref 0.0–0.5)
Eosinophils Relative: 2 %
HCT: 33.7 % — ABNORMAL LOW (ref 36.0–46.0)
Hemoglobin: 11 g/dL — ABNORMAL LOW (ref 12.0–15.0)
Immature Granulocytes: 0 %
Lymphocytes Relative: 16 %
Lymphs Abs: 2.6 10*3/uL (ref 0.7–4.0)
MCH: 28.5 pg (ref 26.0–34.0)
MCHC: 32.6 g/dL (ref 30.0–36.0)
MCV: 87.3 fL (ref 80.0–100.0)
Monocytes Absolute: 0.9 10*3/uL (ref 0.1–1.0)
Monocytes Relative: 6 %
Neutro Abs: 11.7 10*3/uL — ABNORMAL HIGH (ref 1.7–7.7)
Neutrophils Relative %: 76 %
Platelets: 448 10*3/uL — ABNORMAL HIGH (ref 150–400)
RBC: 3.86 MIL/uL — ABNORMAL LOW (ref 3.87–5.11)
RDW: 13.5 % (ref 11.5–15.5)
WBC: 15.6 10*3/uL — ABNORMAL HIGH (ref 4.0–10.5)
nRBC: 0 % (ref 0.0–0.2)

## 2022-03-04 LAB — LACTIC ACID, PLASMA: Lactic Acid, Venous: 0.8 mmol/L (ref 0.5–1.9)

## 2022-03-04 LAB — HCG, QUANTITATIVE, PREGNANCY: hCG, Beta Chain, Quant, S: 1 m[IU]/mL (ref <5)

## 2022-03-04 NOTE — ED Provider Triage Note (Addendum)
Emergency Medicine Provider Triage Evaluation Note ? ?Michele Richardson , a 31 y.o. female  was evaluated in triage.  Pt complains of right breast pain. Was seen in the ER on Sunday and started taking doxycycline. She reports she has taken it and it has been getting worse. She has been having fevers, chills, worsening pain, and worsening swelling. Breast reduction in January. She has called her surgeon who recommended coming into the ER for possible "blood infection" Dr. Odis Luster with [facility].  ? ?Review of Systems  ?Positive:  ?Negative:  ? ?Physical Exam  ?BP (!) 144/104 (BP Location: Right Arm)   Pulse 90   Temp 99.9 ?F (37.7 ?C) (Oral)   Resp 16   LMP 02/28/2022   SpO2 100%  ?Gen:   Awake, no distress   ?Resp:  Normal effort  ?MSK:   Moves extremities without difficulty  ?Other:  Significant swelling and erythema and warmth to the right breast, retracted nipple.  ? ?Medical Decision Making  ?Medically screening exam initiated at 5:53 PM.  Appropriate orders placed.  Michele Richardson was informed that the remainder of the evaluation will be completed by another provider, this initial triage assessment does not replace that evaluation, and the importance of remaining in the ED until their evaluation is complete. ? ?Elevated temperature in triage. Given failed outpatient clinic treatment, will order labs.  ? ?Discussed with attending. Will order CT. ?  ?Achille Rich, PA-C ?03/04/22 1801 ? ?  ?Achille Rich, PA-C ?03/04/22 1812 ? ?

## 2022-03-04 NOTE — ED Notes (Signed)
Patient states she followed up with surgery.  ?

## 2022-03-04 NOTE — ED Triage Notes (Signed)
Pt reports pain and swelling to right breast s/p breast reduction surgery in January. She has not been able to get appt with her PCP and was seen at [facility] on 4/9 and it has gotten worse. Redness and hardness surrounding right areola. ?

## 2022-03-05 ENCOUNTER — Encounter (HOSPITAL_COMMUNITY): Payer: Self-pay | Admitting: Plastic Surgery

## 2022-03-05 ENCOUNTER — Inpatient Hospital Stay (HOSPITAL_COMMUNITY): Payer: Medicaid Other | Admitting: Certified Registered"

## 2022-03-05 ENCOUNTER — Encounter (HOSPITAL_COMMUNITY)
Admission: RE | Disposition: A | Discharge status: Home / Self Care | Payer: Self-pay | Source: Ambulatory Visit | Attending: Plastic Surgery

## 2022-03-05 ENCOUNTER — Other Ambulatory Visit: Payer: Self-pay

## 2022-03-05 ENCOUNTER — Ambulatory Visit (HOSPITAL_COMMUNITY)
Admission: RE | Admit: 2022-03-05 | Discharge: 2022-03-05 | Disposition: A | Discharge status: Home / Self Care | Payer: Medicaid Other | Source: Ambulatory Visit | Attending: Plastic Surgery | Admitting: Plastic Surgery

## 2022-03-05 ENCOUNTER — Ambulatory Visit: Payer: Self-pay | Admitting: Plastic Surgery

## 2022-03-05 ENCOUNTER — Other Ambulatory Visit: Payer: Self-pay | Admitting: Plastic Surgery

## 2022-03-05 DIAGNOSIS — N6489 Other specified disorders of breast: Secondary | ICD-10-CM

## 2022-03-05 DIAGNOSIS — Z9889 Other specified postprocedural states: Principal | ICD-10-CM

## 2022-03-05 HISTORY — DX: Anxiety disorder, unspecified: F41.9

## 2022-03-05 HISTORY — DX: Iron deficiency: E61.1

## 2022-03-05 HISTORY — DX: Essential (primary) hypertension: I10

## 2022-03-05 HISTORY — PX: INCISION AND DRAINAGE OF WOUND: SHX1803

## 2022-03-05 SURGERY — IRRIGATION AND DEBRIDEMENT WOUND
Anesthesia: General | Laterality: Right

## 2022-03-05 MED ORDER — GLYCOPYRROLATE 0.2 MG/ML IJ SOLN
INTRAMUSCULAR | Status: DC | PRN
  Administered 2022-03-05: .2 mg via INTRAVENOUS

## 2022-03-05 MED ORDER — FENTANYL CITRATE (PF) 250 MCG/5ML IJ SOLN
INTRAMUSCULAR | Status: DC | PRN
Start: 2022-03-05 — End: 2022-03-05
  Administered 2022-03-05 (×2): 25 ug via INTRAVENOUS

## 2022-03-05 MED ORDER — POVIDONE-IODINE 10 % EX SOLN
CUTANEOUS | Status: DC | PRN
Start: 2022-03-05 — End: 2022-03-05
  Administered 2022-03-05: 1 via TOPICAL

## 2022-03-05 MED ORDER — LACTATED RINGERS IV SOLN: INTRAVENOUS | Status: DC

## 2022-03-05 MED ORDER — ORAL CARE MOUTH RINSE: 15.0000 mL | Freq: Once | OROMUCOSAL | Status: AC

## 2022-03-05 MED ORDER — OXYCODONE HCL 5 MG PO TABS: 5.0000 mg | ORAL_TABLET | Freq: Once | ORAL | Status: DC | PRN

## 2022-03-05 MED ORDER — FENTANYL CITRATE (PF) 250 MCG/5ML IJ SOLN
INTRAMUSCULAR | Status: AC
  Filled 2022-03-05: qty 5

## 2022-03-05 MED ORDER — MIDAZOLAM HCL 2 MG/2ML IJ SOLN
INTRAMUSCULAR | Status: DC | PRN
  Administered 2022-03-05: 2 mg via INTRAVENOUS

## 2022-03-05 MED ORDER — SCOPOLAMINE 1 MG/3DAYS TD PT72
1.0000 | MEDICATED_PATCH | TRANSDERMAL | Status: DC
  Administered 2022-03-05: 1.5 mg via TRANSDERMAL
  Filled 2022-03-05: qty 1

## 2022-03-05 MED ORDER — BACITRACIN ZINC 500 UNIT/GM EX OINT
TOPICAL_OINTMENT | CUTANEOUS | Status: AC
  Filled 2022-03-05: qty 28.35

## 2022-03-05 MED ORDER — HYDROMORPHONE HCL 1 MG/ML IJ SOLN
0.2500 mg | INTRAMUSCULAR | Status: DC | PRN
  Administered 2022-03-05 (×2): 0.5 mg via INTRAVENOUS

## 2022-03-05 MED ORDER — HYDROMORPHONE HCL 1 MG/ML IJ SOLN
INTRAMUSCULAR | Status: AC
  Filled 2022-03-05: qty 1

## 2022-03-05 MED ORDER — LACTATED RINGERS IV SOLN: INTRAVENOUS | Status: DC | PRN

## 2022-03-05 MED ORDER — CEFAZOLIN SODIUM-DEXTROSE 2-4 GM/100ML-% IV SOLN
2.0000 g | INTRAVENOUS | Status: AC
  Administered 2022-03-05: 2 g via INTRAVENOUS
  Filled 2022-03-05: qty 100

## 2022-03-05 MED ORDER — ONDANSETRON HCL 4 MG/2ML IJ SOLN
INTRAMUSCULAR | Status: DC | PRN
  Administered 2022-03-05: 4 mg via INTRAVENOUS

## 2022-03-05 MED ORDER — AMISULPRIDE (ANTIEMETIC) 5 MG/2ML IV SOLN: 10.0000 mg | Freq: Once | INTRAVENOUS | Status: DC | PRN

## 2022-03-05 MED ORDER — PROPOFOL 10 MG/ML IV BOLUS
INTRAVENOUS | Status: DC | PRN
  Administered 2022-03-05: 200 mg via INTRAVENOUS

## 2022-03-05 MED ORDER — CHLORHEXIDINE GLUCONATE 0.12 % MT SOLN
OROMUCOSAL | Status: AC
  Administered 2022-03-05: 15 mL via OROMUCOSAL
  Filled 2022-03-05: qty 15

## 2022-03-05 MED ORDER — PHENYLEPHRINE 40 MCG/ML (10ML) SYRINGE FOR IV PUSH (FOR BLOOD PRESSURE SUPPORT)
PREFILLED_SYRINGE | INTRAVENOUS | Status: AC
  Filled 2022-03-05: qty 10

## 2022-03-05 MED ORDER — HEPARIN SODIUM (PORCINE) 5000 UNIT/ML IJ SOLN
5000.0000 [IU] | Freq: Once | INTRAMUSCULAR | Status: AC
  Administered 2022-03-05: 5000 [IU] via SUBCUTANEOUS
  Filled 2022-03-05: qty 1

## 2022-03-05 MED ORDER — PROPOFOL 10 MG/ML IV BOLUS
INTRAVENOUS | Status: AC
  Filled 2022-03-05: qty 20

## 2022-03-05 MED ORDER — MIDAZOLAM HCL 2 MG/2ML IJ SOLN
INTRAMUSCULAR | Status: AC
  Filled 2022-03-05: qty 2

## 2022-03-05 MED ORDER — DEXAMETHASONE SODIUM PHOSPHATE 10 MG/ML IJ SOLN
INTRAMUSCULAR | Status: DC | PRN
Start: 2022-03-05 — End: 2022-03-05
  Administered 2022-03-05: 10 mg via INTRAVENOUS

## 2022-03-05 MED ORDER — CHLORHEXIDINE GLUCONATE CLOTH 2 % EX PADS: 6.0000 | MEDICATED_PAD | Freq: Once | CUTANEOUS | Status: DC

## 2022-03-05 MED ORDER — LIDOCAINE HCL (CARDIAC) PF 100 MG/5ML IV SOSY
PREFILLED_SYRINGE | INTRAVENOUS | Status: DC | PRN
  Administered 2022-03-05: 100 mg via INTRATRACHEAL

## 2022-03-05 MED ORDER — OXYCODONE HCL 5 MG/5ML PO SOLN: 5.0000 mg | Freq: Once | ORAL | Status: DC | PRN

## 2022-03-05 MED ORDER — ACETAMINOPHEN 500 MG PO TABS
1000.0000 mg | ORAL_TABLET | Freq: Once | ORAL | Status: AC
  Administered 2022-03-05: 1000 mg via ORAL
  Filled 2022-03-05: qty 2

## 2022-03-05 MED ORDER — CHLORHEXIDINE GLUCONATE 0.12 % MT SOLN: 15.0000 mL | Freq: Once | OROMUCOSAL | Status: AC

## 2022-03-05 SURGICAL SUPPLY — 44 items
APL PRP STRL LF DISP 70% ISPRP (MISCELLANEOUS) ×1
ATCH SMKEVC FLXB CAUT HNDSWH (FILTER) IMPLANT
BAG COUNTER SPONGE SURGICOUNT (BAG) ×3 IMPLANT
BAG SPNG CNTER NS LX DISP (BAG) ×1
BINDER BREAST XXLRG (GAUZE/BANDAGES/DRESSINGS) ×1 IMPLANT
BIOPATCH RED 1 DISK 7.0 (GAUZE/BANDAGES/DRESSINGS) ×4 IMPLANT
BNDG ELASTIC 6X5.8 VLCR STR LF (GAUZE/BANDAGES/DRESSINGS) ×1 IMPLANT
BNDG GAUZE ELAST 4 BULKY (GAUZE/BANDAGES/DRESSINGS) ×1 IMPLANT
CANISTER SUCT 3000ML PPV (MISCELLANEOUS) ×3 IMPLANT
CHLORAPREP W/TINT 26 (MISCELLANEOUS) ×3 IMPLANT
COVER SURGICAL LIGHT HANDLE (MISCELLANEOUS) ×3 IMPLANT
DRAIN CHANNEL 19F RND (DRAIN) IMPLANT
DRAPE IMP U-DRAPE 54X76 (DRAPES) ×3 IMPLANT
DRAPE LAPAROSCOPIC ABDOMINAL (DRAPES) ×3 IMPLANT
DRSG PAD ABDOMINAL 8X10 ST (GAUZE/BANDAGES/DRESSINGS) IMPLANT
ELECT REM PT RETURN 9FT ADLT (ELECTROSURGICAL) ×2
ELECTRODE REM PT RTRN 9FT ADLT (ELECTROSURGICAL) ×2 IMPLANT
EVACUATOR SILICONE 100CC (DRAIN) IMPLANT
EVACUATOR SMOKE ACCUVAC VALLEY (FILTER)
GAUZE SPONGE 4X4 12PLY STRL (GAUZE/BANDAGES/DRESSINGS) IMPLANT
GAUZE SPONGE 4X4 12PLY STRL LF (GAUZE/BANDAGES/DRESSINGS) ×1 IMPLANT
GLOVE BIO SURGEON STRL SZ7.5 (GLOVE) ×3 IMPLANT
GLOVE BIOGEL PI IND STRL 8 (GLOVE) ×2 IMPLANT
GLOVE BIOGEL PI INDICATOR 8 (GLOVE) ×1
GOWN STRL REUS W/ TWL LRG LVL3 (GOWN DISPOSABLE) ×4 IMPLANT
GOWN STRL REUS W/ TWL XL LVL3 (GOWN DISPOSABLE) ×2 IMPLANT
GOWN STRL REUS W/TWL LRG LVL3 (GOWN DISPOSABLE) ×4
GOWN STRL REUS W/TWL XL LVL3 (GOWN DISPOSABLE) ×2
KIT BASIN OR (CUSTOM PROCEDURE TRAY) ×3 IMPLANT
KIT TURNOVER KIT B (KITS) ×3 IMPLANT
MARKER SKIN DUAL TIP RULER LAB (MISCELLANEOUS) ×3 IMPLANT
NS IRRIG 1000ML POUR BTL (IV SOLUTION) ×3 IMPLANT
PACK GENERAL/GYN (CUSTOM PROCEDURE TRAY) ×3 IMPLANT
PACK UNIVERSAL I (CUSTOM PROCEDURE TRAY) ×3 IMPLANT
PAD ABD 8X10 STRL (GAUZE/BANDAGES/DRESSINGS) ×4 IMPLANT
PAD ARMBOARD 7.5X6 YLW CONV (MISCELLANEOUS) ×6 IMPLANT
PREFILTER EVAC NS 1 1/3-3/8IN (MISCELLANEOUS) IMPLANT
STAPLER VISISTAT 35W (STAPLE) ×3 IMPLANT
SUT MNCRL AB 3-0 PS2 18 (SUTURE) ×8 IMPLANT
SUT PROLENE 3 0 PS 2 (SUTURE) ×5 IMPLANT
SUT VIC AB 3-0 SH 18 (SUTURE) ×4 IMPLANT
TOWEL GREEN STERILE (TOWEL DISPOSABLE) ×3 IMPLANT
TOWEL GREEN STERILE FF (TOWEL DISPOSABLE) ×3 IMPLANT
WATER STERILE IRR 1000ML POUR (IV SOLUTION) IMPLANT

## 2022-03-05 NOTE — Discharge Instructions (Addendum)
Take Keflex as prescribed. Prescription is at your pharmacy. ? ?No lifting or exercise. No shower yet. ? ?Keep dressings in place and dry. ? ?You may see some drainage on the dressings. This is expected. ? ?Prop up.  ? ?Do not use heat or ice in the surgical area. ? ?I will arrange a time to meet you at the office tomorrow. ? ?For questions call [phone] during business hours or [phone] after hours and weekends. ?

## 2022-03-05 NOTE — Transfer of Care (Signed)
Immediate Anesthesia Transfer of Care Note ? ?Patient: Michele Richardson ? ?Procedure(s) Performed: IRRIGATION AND DEBRIDEMENT OF RIGHT BREAT OF FLUID COLLECTION POSSIBLE ABCESS (Right) ? ?Patient Location: PACU ? ?Anesthesia Type:General ? ?Level of Consciousness: oriented, drowsy, patient cooperative and responds to stimulation ? ?Airway & Oxygen Therapy: Patient Spontanous Breathing and Patient connected to nasal cannula oxygen ? ?Post-op Assessment: Report given to RN, Post -op Vital signs reviewed and stable and Patient moving all extremities X 4 ? ?Post vital signs: Reviewed and stable ? ?Last Vitals:  ?Vitals Value Taken Time  ?BP 124/90 03/05/22 1249  ?Temp    ?Pulse 76 03/05/22 1252  ?Resp 15 03/05/22 1252  ?SpO2 100 % 03/05/22 1252  ?Vitals shown include unvalidated device data. ? ?Last Pain:  ?Vitals:  ? 03/05/22 1102  ?TempSrc:   ?PainSc: 0-No pain  ?   ? ?  ? ?Complications: No notable events documented. ?

## 2022-03-05 NOTE — Brief Op Note (Signed)
03/05/2022 ? ?12:54 PM ? ?PATIENT:  Michele Richardson  31 y.o. female ? ?PRE-OPERATIVE DIAGNOSIS:  Fluid collection possible abcess of Right breast ? ?POST-OPERATIVE DIAGNOSIS:  Fluid collection possible abcess of Right breast ? ?PROCEDURE:  Procedure(s): ?IRRIGATION AND DEBRIDEMENT OF RIGHT BREAT OF FLUID COLLECTION POSSIBLE ABCESS (Right) ? ?SURGEON:  Surgeon(s) and Role: ?   Etter Sjogren, MD - Primary ? ?PHYSICIAN ASSISTANT:  ? ?ASSISTANTS: none  ? ?ANESTHESIA:   general ? ?EBL:  10 mL  ? ?BLOOD ADMINISTERED:none ? ?DRAINS: none  ? ?LOCAL MEDICATIONS USED:  NONE ? ?SPECIMEN:  Source of Specimen:  Right breast ? ?DISPOSITION OF SPECIMEN:  PATHOLOGY ? ?COUNTS:  YES ? ?TOURNIQUET:  * No tourniquets in log * ? ?DICTATION: .Other Dictation: Dictation Number   ? ?PLAN OF CARE: Discharge to home after PACU ? ?PATIENT DISPOSITION:  PACU - hemodynamically stable. ?  ?Delay start of Pharmacological VTE agent (>24hrs) due to surgical blood loss or risk of bleeding: not applicable ? ?

## 2022-03-05 NOTE — Anesthesia Procedure Notes (Addendum)
Procedure Name: LMA Insertion ?Date/Time: 03/05/2022 11:55 AM ?Performed by: Melina Schools, CRNA ?Pre-anesthesia Checklist: Patient identified, Emergency Drugs available, Suction available and Patient being monitored ?Patient Re-evaluated:Patient Re-evaluated prior to induction ?Oxygen Delivery Method: Circle system utilized ?Preoxygenation: Pre-oxygenation with 100% oxygen ?Induction Type: IV induction ?Ventilation: Mask ventilation without difficulty ?LMA: LMA inserted ?LMA Size: 4.0 ?Number of attempts: 1 ?Placement Confirmation: positive ETCO2 and breath sounds checked- equal and bilateral ?Tube secured with: Tape ?Dental Injury: Teeth and Oropharynx as per pre-operative assessment  ? ? ? ? ?

## 2022-03-05 NOTE — Anesthesia Preprocedure Evaluation (Addendum)
Anesthesia Evaluation  ?Patient identified by MRN, date of birth, ID band ?Patient awake ? ? ? ?Reviewed: ?Allergy & Precautions, NPO status , Patient's Chart, lab work & pertinent test results ? ?Airway ?Mallampati: II ? ?TM Distance: >3 FB ?Neck ROM: Full ? ? ? Dental ? ?(+) Dental Advisory Given, Teeth Intact ?  ?Pulmonary ?neg pulmonary ROS,  ?  ?Pulmonary exam normal ?breath sounds clear to auscultation ? ? ? ? ? ? Cardiovascular ?hypertension, Pt. on medications ?Normal cardiovascular exam ?Rhythm:Regular Rate:Normal ? ? ?  ?Neuro/Psych ?negative neurological ROS ?   ? GI/Hepatic ?negative GI ROS, Neg liver ROS,   ?Endo/Other  ?diabetes ? Renal/GU ?negative Renal ROS  ? ?  ?Musculoskeletal ?negative musculoskeletal ROS ?(+)  ? Abdominal ?(+) + obese,   ?Peds ? Hematology ? ?(+) Blood dyscrasia, anemia ,   ?Anesthesia Other Findings ? ? Reproductive/Obstetrics ?negative OB ROS ? ?  ? ? ? ? ? ? ? ? ? ? ? ? ? ?  ?  ? ? ? ? ? ? ?Anesthesia Physical ?Anesthesia Plan ? ?ASA: 2 ? ?Anesthesia Plan: General  ? ?Post-op Pain Management: Tylenol PO (pre-op)* and Toradol IV (intra-op)*  ? ?Induction: Intravenous ? ?PONV Risk Score and Plan: 3 and Ondansetron, Dexamethasone, Treatment may vary due to age or medical condition, Scopolamine patch - Pre-op and Midazolam ? ?Airway Management Planned: LMA ? ?Additional Equipment: None ? ?Intra-op Plan:  ? ?Post-operative Plan: Extubation in OR ? ?Informed Consent: I have reviewed the patients History and Physical, chart, labs and discussed the procedure including the risks, benefits and alternatives for the proposed anesthesia with the patient or authorized representative who has indicated his/her understanding and acceptance.  ? ? ? ?Dental advisory given ? ?Plan Discussed with: CRNA ? ?Anesthesia Plan Comments:   ? ? ? ? ?Anesthesia Quick Evaluation ? ?

## 2022-03-05 NOTE — Anesthesia Postprocedure Evaluation (Signed)
Anesthesia Post Note ? ?Patient: Michele Richardson ? ?Procedure(s) Performed: IRRIGATION AND DEBRIDEMENT OF RIGHT BREAT OF FLUID COLLECTION POSSIBLE ABCESS (Right) ? ?  ? ?Patient location during evaluation: PACU ?Anesthesia Type: General ?Level of consciousness: sedated and patient cooperative ?Pain management: pain level controlled ?Vital Signs Assessment: post-procedure vital signs reviewed and stable ?Respiratory status: spontaneous breathing ?Cardiovascular status: stable ?Anesthetic complications: no ? ? ?No notable events documented. ? ?Last Vitals:  ?Vitals:  ? 03/05/22 1349 03/05/22 1404  ?BP: 106/73 120/88  ?Pulse: 61 73  ?Resp: 18 14  ?Temp:  36.7 ?C  ?SpO2: 99% 99%  ?  ?Last Pain:  ?Vitals:  ? 03/05/22 1330  ?TempSrc:   ?PainSc: Asleep  ? ? ?  ?  ?  ?  ?  ?  ? ?Lewie Loron ? ? ? ? ?

## 2022-03-06 ENCOUNTER — Encounter (HOSPITAL_COMMUNITY): Payer: Self-pay | Admitting: Plastic Surgery

## 2022-03-06 NOTE — Op Note (Signed)
NAME: Michele Richardson, Laylaa ?MEDICAL RECORD NO: [id number] ?ACCOUNT NO: [id number] ?DATE OF BIRTH: Dec 27, 1990 ?FACILITY: [facility] ?LOCATION: [facility] ?PHYSICIAN: Youlanda Roys. Harlow Mares, MD ? ?Operative Report  ? ?DATE OF PROCEDURE: 03/05/2022 ? ?PREOPERATIVE DIAGNOSIS:  Probable right breast abscess with fat necrosis. ? ?POSTOPERATIVE DIAGNOSIS:  Probable right breast abscess with fat necrosis. ? ?PROCEDURE PERFORMED:  Incision and drainage of small abscess right breast and excision of fat necrosis right breast. ? ?SURGEON:  Youlanda Roys. Harlow Mares, MD ? ?ANESTHESIA:  General. ? ?ESTIMATED BLOOD LOSS:  20 mL ? ?CLINICAL NOTE:  This 31 year old woman had a breast reduction more than 3 months ago.  She had done extremely well.  A couple of days ago, she noticed some redness and a little bit of firmness and tenderness in the periareolar area of the right breast.   ?No significant fever.  She went to the Emergency Room last night and an ultrasound revealed a collection of fluid behind the nipple areolar complex, approximately 4 cm in its greatest dimension, consistent with a possible abscess.  She was asked to come  ?to my office this morning.  On examination this morning, she did have a little bit of periareolar erythema, especially superiorly.  Also, a little bit of firmness superiorly.  It was felt that this did represent possibly some fat necrosis in association  ?with a small abscess.  It was felt that she should be debrided and drained.  The procedure, risks and possible complications were discussed with her in detail and she understood all this and all of her questions were answered.  She wished to proceed. ? ?DESCRIPTION OF PROCEDURE:  The patient was taken to the operating room and placed supine.  After successfully under general anesthesia, she was prepped with ChloraPrep, and she was then draped with sterile drapes.  A periareolar incision was made along  ?the superior border of the areola and the dissection carried down through the  subcutaneous tissue sharply.  The underlying abscess was encountered.  Cultures were taken including a Gram stain.  The dissection was continued a little bit deeper to make  ?sure that the entire cavity had been unroofed and exposed.  There was some associated fat necrosis in the area and it was excised.  Hemostasis was achieved using the electrocautery.  Thorough irrigation was performed with saline.  Excellent hemostasis  ?was confirmed.  Betadine was placed in the wound and then a Kerlix soaked with saline was packed.  The medial and lateral aspects of the incision were then closed with 3-0 Prolene simple interrupted sutures, leaving the central aspect of the incision  ?open with a Kerlix had been packed.  Dry sterile dressings were placed and 6-inch Ace wrap with the breast waist.  She was then transferred to the recovery room in stable, having tolerated the procedure well. ? ?DISPOSITION:  She will be seen tomorrow for a dressing change. ? ? ?VAI ?D: 03/05/2022 12:51:36 pm T: 03/06/2022 12:54:00 am  ?JOB: [id number]  ?

## 2022-03-08 LAB — SURGICAL PATHOLOGY

## 2022-03-08 NOTE — Discharge Summary (Signed)
Not required. This was out patient surgery. ?

## 2022-03-09 LAB — CULTURE, BLOOD (ROUTINE X 2)
Culture: NO GROWTH
Special Requests: ADEQUATE

## 2022-03-10 LAB — AEROBIC CULTURE W GRAM STAIN (SUPERFICIAL SPECIMEN)

## 2022-03-10 LAB — AEROBIC/ANAEROBIC CULTURE W GRAM STAIN (SURGICAL/DEEP WOUND)

## 2022-05-02 ENCOUNTER — Emergency Department (HOSPITAL_BASED_OUTPATIENT_CLINIC_OR_DEPARTMENT_OTHER)
Admission: EM | Admit: 2022-05-02 | Discharge: 2022-05-02 | Disposition: A | Discharge status: Home / Self Care | Payer: Medicaid Other | Attending: Emergency Medicine | Admitting: Emergency Medicine

## 2022-05-02 ENCOUNTER — Other Ambulatory Visit: Payer: Self-pay

## 2022-05-02 ENCOUNTER — Emergency Department (HOSPITAL_BASED_OUTPATIENT_CLINIC_OR_DEPARTMENT_OTHER): Payer: Medicaid Other

## 2022-05-02 ENCOUNTER — Encounter (HOSPITAL_BASED_OUTPATIENT_CLINIC_OR_DEPARTMENT_OTHER): Payer: Self-pay

## 2022-05-02 DIAGNOSIS — S60211A Contusion of right wrist, initial encounter: Secondary | ICD-10-CM | POA: Insufficient documentation

## 2022-05-02 DIAGNOSIS — W19XXXA Unspecified fall, initial encounter: Secondary | ICD-10-CM

## 2022-05-02 DIAGNOSIS — S60511A Abrasion of right hand, initial encounter: Secondary | ICD-10-CM | POA: Insufficient documentation

## 2022-05-02 DIAGNOSIS — I10 Essential (primary) hypertension: Secondary | ICD-10-CM | POA: Diagnosis not present

## 2022-05-02 DIAGNOSIS — M25531 Pain in right wrist: Secondary | ICD-10-CM | POA: Diagnosis not present

## 2022-05-02 DIAGNOSIS — Z79899 Other long term (current) drug therapy: Secondary | ICD-10-CM | POA: Insufficient documentation

## 2022-05-02 DIAGNOSIS — W010XXA Fall on same level from slipping, tripping and stumbling without subsequent striking against object, initial encounter: Secondary | ICD-10-CM | POA: Diagnosis not present

## 2022-05-02 DIAGNOSIS — R109 Unspecified abdominal pain: Secondary | ICD-10-CM | POA: Insufficient documentation

## 2022-05-02 DIAGNOSIS — S60419A Abrasion of unspecified finger, initial encounter: Secondary | ICD-10-CM

## 2022-05-02 DIAGNOSIS — S6991XA Unspecified injury of right wrist, hand and finger(s), initial encounter: Secondary | ICD-10-CM | POA: Diagnosis present

## 2022-05-02 NOTE — ED Triage Notes (Signed)
POV, pt sts that she was running earlier and tripped, pain to right hand and forearm, abrasions noted to right hand, pain to abd from fall. ambulatory

## 2022-05-02 NOTE — Discharge Instructions (Signed)
All the x-rays are normal.  You will be very sore tomorrow.  You can wear the Velcro wrist brace as long as you need.  Keep the bandages on till tomorrow and then you can just cover the wounds with Vaseline and a bandage.

## 2022-05-03 NOTE — ED Provider Notes (Signed)
[facility] EMERGENCY DEPT Provider Note   CSN: [id number] Arrival date & time: 05/02/22  2042     History  Chief Complaint  Patient presents with   Hand Injury    Michele Richardson is a 31 y.o. female.  Patient is a 31 year old female with a history of hypertension presenting today after a fall.  Patient reports she was chasing her son when she lost her balance and fell forward landing on her right arm and abdomen on the concrete.  She denies hitting her head or loss of consciousness.  Since that time she has had a lot of pain on the lateral portion of her right hand and wrist as well as across to her abdomen.  Pain is worse when she moves it and she has noted abrasions and some swelling.  No numbness or tingling in her fingers but it is painful when she bends her fourth and fifth digit.  Also pain with flexion of the wrist.  The history is provided by the patient.  Hand Injury      Home Medications Prior to Admission medications   Medication Sig Start Date End Date Taking? Authorizing Provider  FLUoxetine (PROZAC) 40 MG capsule Take 40 mg by mouth daily.    [provider]  hydrOXYzine (ATARAX/VISTARIL) 25 MG tablet Take 1 tablet (25 mg total) by mouth every 6 (six) hours. Patient taking differently: Take 25 mg by mouth daily as needed for anxiety or itching. 09/16/21   Couture, Cortni S, PA-C  lisinopril-hydrochlorothiazide (ZESTORETIC) 20-12.5 MG tablet Take 1 tablet by mouth daily.    [provider]      Allergies    Shellfish allergy, Eggs or egg-derived products, Nutritional supplements, Other, and Sulfa antibiotics    Review of Systems   Review of Systems  Physical Exam Updated Vital Signs BP 121/81 (BP Location: Right Arm)   Pulse 70   Temp 98.9 F (37.2 C) (Oral)   Resp 16   Ht 5\' 7"  (1.702 m)   Wt 104.3 kg   LMP 03/30/2022 (Exact Date)   SpO2 99%   BMI 36.02 kg/m  Physical Exam Vitals and nursing note reviewed.   Constitutional:      General: She is not in acute distress.    Appearance: Normal appearance.  HENT:     Head: Normocephalic.  Cardiovascular:     Rate and Rhythm: Normal rate.  Pulmonary:     Effort: Pulmonary effort is normal.  Abdominal:     Palpations: Abdomen is soft.     Tenderness: There is no abdominal tenderness.     Comments: Abrasions noted across the lower abdomen  Musculoskeletal:        General: Tenderness and signs of injury present.     Left wrist: Swelling and tenderness present. No snuff box tenderness.       Arms:       Hands:     Comments: Pain with flexion extension of the wrist.  All pain is on the lateral portion of the wrist.  Pain with flexion extension of the DIP PIP and MCP joint on the fourth and fifth digits but she is able to do it.  Skin:    General: Skin is warm and dry.  Neurological:     Mental Status: She is alert. Mental status is at baseline.     ED Results / Procedures / Treatments   Labs (all labs ordered are listed, but only abnormal results are displayed) Labs Reviewed - No  data to display  EKG None  Radiology DG Hand Complete Right  Result Date: 05/02/2022 CLINICAL DATA:  Fall and trauma to the right upper extremity. EXAM: RIGHT HAND - COMPLETE 3+ VIEW; RIGHT FOREARM - 2 VIEW COMPARISON:  None Available. FINDINGS: There is no evidence of fracture or dislocation. There is no evidence of arthropathy or other focal bone abnormality. Soft tissues are unremarkable. IMPRESSION: Negative. Electronically Signed   By: Elgie Collard M.D.   On: 05/02/2022 21:34   DG Forearm Right  Result Date: 05/02/2022 CLINICAL DATA:  Fall and trauma to the right upper extremity. EXAM: RIGHT HAND - COMPLETE 3+ VIEW; RIGHT FOREARM - 2 VIEW COMPARISON:  None Available. FINDINGS: There is no evidence of fracture or dislocation. There is no evidence of arthropathy or other focal bone abnormality. Soft tissues are unremarkable. IMPRESSION: Negative.  Electronically Signed   By: Elgie Collard M.D.   On: 05/02/2022 21:34    Procedures Procedures    Medications Ordered in ED Medications - No data to display  ED Course/ Medical Decision Making/ A&P                           Medical Decision Making Amount and/or Complexity of Data Reviewed Radiology: ordered.   Patient presenting after a trip and fall.  Injury to the right hand and wrist as well as abrasions over the lower abdomen.  Mild abrasions to the left hand but no significant pain or wrist pain. I have independently visualized and interpreted pt's images today. X-rays are negative for fracture in the hand or wrist.  Radiology reports no acute findings. Findings were discussed with the patient.  Wounds were cleaned and dressed.  She was placed in a wrist splint for comfort.  Discharged home in good condition.       Final Clinical Impression(s) / ED Diagnoses Final diagnoses:  Abrasion of finger of right hand, initial encounter  Contusion of right wrist, initial encounter  Fall, initial encounter    Rx / DC Orders ED Discharge Orders     None         Gwyneth Sprout, MD 05/03/22 0022

## 2022-07-30 ENCOUNTER — Other Ambulatory Visit: Payer: Self-pay

## 2022-07-30 ENCOUNTER — Emergency Department (HOSPITAL_BASED_OUTPATIENT_CLINIC_OR_DEPARTMENT_OTHER): Payer: Medicaid Other

## 2022-07-30 ENCOUNTER — Encounter (HOSPITAL_BASED_OUTPATIENT_CLINIC_OR_DEPARTMENT_OTHER): Payer: Self-pay | Admitting: Emergency Medicine

## 2022-07-30 ENCOUNTER — Emergency Department (HOSPITAL_BASED_OUTPATIENT_CLINIC_OR_DEPARTMENT_OTHER)
Admission: EM | Admit: 2022-07-30 | Discharge: 2022-07-30 | Disposition: A | Discharge status: Home / Self Care | Payer: Medicaid Other | Attending: Emergency Medicine | Admitting: Emergency Medicine

## 2022-07-30 DIAGNOSIS — D72829 Elevated white blood cell count, unspecified: Secondary | ICD-10-CM | POA: Diagnosis not present

## 2022-07-30 DIAGNOSIS — K8020 Calculus of gallbladder without cholecystitis without obstruction: Secondary | ICD-10-CM | POA: Diagnosis not present

## 2022-07-30 DIAGNOSIS — R1013 Epigastric pain: Secondary | ICD-10-CM | POA: Diagnosis present

## 2022-07-30 DIAGNOSIS — K529 Noninfective gastroenteritis and colitis, unspecified: Secondary | ICD-10-CM | POA: Insufficient documentation

## 2022-07-30 DIAGNOSIS — K5730 Diverticulosis of large intestine without perforation or abscess without bleeding: Secondary | ICD-10-CM | POA: Diagnosis not present

## 2022-07-30 LAB — URINALYSIS, ROUTINE W REFLEX MICROSCOPIC
Bilirubin Urine: NEGATIVE
Glucose, UA: NEGATIVE mg/dL
Hgb urine dipstick: NEGATIVE
Ketones, ur: NEGATIVE mg/dL
Leukocytes,Ua: NEGATIVE
Nitrite: NEGATIVE
Protein, ur: 100 mg/dL — AB
Specific Gravity, Urine: 1.039 — ABNORMAL HIGH (ref 1.005–1.030)
pH: 5.5 (ref 5.0–8.0)

## 2022-07-30 LAB — COMPREHENSIVE METABOLIC PANEL
ALT: 25 U/L (ref 0–44)
AST: 17 U/L (ref 15–41)
Albumin: 4.7 g/dL (ref 3.5–5.0)
Alkaline Phosphatase: 55 U/L (ref 38–126)
Anion gap: 11 (ref 5–15)
BUN: 12 mg/dL (ref 6–20)
CO2: 27 mmol/L (ref 22–32)
Calcium: 9.6 mg/dL (ref 8.9–10.3)
Chloride: 102 mmol/L (ref 98–111)
Creatinine, Ser: 0.78 mg/dL (ref 0.44–1.00)
GFR, Estimated: >60 mL/min (ref >60)
Glucose, Bld: 111 mg/dL — ABNORMAL HIGH (ref 70–99)
Potassium: 3.9 mmol/L (ref 3.5–5.1)
Sodium: 140 mmol/L (ref 135–145)
Total Bilirubin: 0.4 mg/dL (ref 0.3–1.2)
Total Protein: 8.6 g/dL — ABNORMAL HIGH (ref 6.5–8.1)

## 2022-07-30 LAB — CBC
HCT: 39.6 % (ref 36.0–46.0)
Hemoglobin: 13.2 g/dL (ref 12.0–15.0)
MCH: 27.4 pg (ref 26.0–34.0)
MCHC: 33.3 g/dL (ref 30.0–36.0)
MCV: 82.2 fL (ref 80.0–100.0)
Platelets: 465 10*3/uL — ABNORMAL HIGH (ref 150–400)
RBC: 4.82 MIL/uL (ref 3.87–5.11)
RDW: 14.1 % (ref 11.5–15.5)
WBC: 19 10*3/uL — ABNORMAL HIGH (ref 4.0–10.5)
nRBC: 0 % (ref 0.0–0.2)

## 2022-07-30 LAB — LIPASE, BLOOD: Lipase: 21 U/L (ref 11–51)

## 2022-07-30 LAB — PREGNANCY, URINE: Preg Test, Ur: NEGATIVE

## 2022-07-30 MED ORDER — IOHEXOL 300 MG/ML  SOLN
100.0000 mL | Freq: Once | INTRAMUSCULAR | Status: AC | PRN
  Administered 2022-07-30: 100 mL via INTRAVENOUS

## 2022-07-30 MED ORDER — KETOROLAC TROMETHAMINE 30 MG/ML IJ SOLN
30.0000 mg | Freq: Once | INTRAMUSCULAR | Status: AC
Start: 2022-07-30 — End: 2022-07-30
  Administered 2022-07-30: 30 mg via INTRAVENOUS
  Filled 2022-07-30: qty 1

## 2022-07-30 MED ORDER — ONDANSETRON HCL 4 MG/2ML IJ SOLN
4.0000 mg | Freq: Once | INTRAMUSCULAR | Status: AC
  Administered 2022-07-30: 4 mg via INTRAVENOUS
  Filled 2022-07-30: qty 2

## 2022-07-30 MED ORDER — MORPHINE SULFATE (PF) 4 MG/ML IV SOLN
4.0000 mg | Freq: Once | INTRAVENOUS | Status: AC
  Administered 2022-07-30: 4 mg via INTRAVENOUS
  Filled 2022-07-30: qty 1

## 2022-07-30 MED ORDER — SODIUM CHLORIDE 0.9 % IV BOLUS
1000.0000 mL | Freq: Once | INTRAVENOUS | Status: AC
  Administered 2022-07-30: 1000 mL via INTRAVENOUS

## 2022-07-30 NOTE — ED Provider Notes (Signed)
[facility] EMERGENCY DEPT Provider Note   CSN: [id number] Arrival date & time: 07/30/22  1243     History  Chief Complaint  Patient presents with   Abdominal Pain   HPI Michele Richardson is a 31 y.o. female presenting with abdominal pain. Pain started last night. Stated that felt like it came on all of a sudden. Pain is located in epigastric region and radiates to her umbilicus. Pain is pulsating and sharp and intermittent.  Denies chest pain and shortness of breath.  Patient endorses nausea and vomiting but no fever. Also reports that she has not had a bowel movement in 3 days.    Abdominal Pain      Home Medications Prior to Admission medications   Medication Sig Start Date End Date Taking? Authorizing Provider  FLUoxetine (PROZAC) 40 MG capsule Take 40 mg by mouth daily.    [provider]  hydrOXYzine (ATARAX/VISTARIL) 25 MG tablet Take 1 tablet (25 mg total) by mouth every 6 (six) hours. Patient taking differently: Take 25 mg by mouth daily as needed for anxiety or itching. 09/16/21   Couture, Cortni S, PA-C  lisinopril-hydrochlorothiazide (ZESTORETIC) 20-12.5 MG tablet Take 1 tablet by mouth daily.    [provider]      Allergies    Shellfish allergy, Eggs or egg-derived products, Nutritional supplements, Other, and Sulfa antibiotics    Review of Systems   Review of Systems  Gastrointestinal:  Positive for abdominal pain.    Physical Exam Updated Vital Signs BP (!) 132/90   Pulse (!) 136   Temp 98.4 F (36.9 C) (Oral)   Resp 18   Ht 5\' 7"  (1.702 m)   Wt 108.9 kg   SpO2 100%   BMI 37.59 kg/m  Physical Exam Vitals and nursing note reviewed.  HENT:     Head: Normocephalic and atraumatic.     Mouth/Throat:     Mouth: Mucous membranes are moist.  Eyes:     General:        Right eye: No discharge.        Left eye: No discharge.     Conjunctiva/sclera: Conjunctivae normal.  Cardiovascular:     Rate and Rhythm: Normal  rate and regular rhythm.     Pulses: Normal pulses.     Heart sounds: Normal heart sounds.  Pulmonary:     Effort: Pulmonary effort is normal.     Breath sounds: Normal breath sounds.  Abdominal:     General: Abdomen is flat.     Palpations: Abdomen is soft.     Tenderness: There is abdominal tenderness in the right upper quadrant, epigastric area and suprapubic area.  Skin:    General: Skin is warm and dry.  Neurological:     General: No focal deficit present.  Psychiatric:        Mood and Affect: Mood normal.     ED Results / Procedures / Treatments   Labs (all labs ordered are listed, but only abnormal results are displayed) Labs Reviewed  COMPREHENSIVE METABOLIC PANEL - Abnormal; Notable for the following components:      Result Value   Glucose, Bld 111 (*)    Total Protein 8.6 (*)    All other components within normal limits  CBC - Abnormal; Notable for the following components:   WBC 19.0 (*)    Platelets 465 (*)    All other components within normal limits  URINALYSIS, ROUTINE W REFLEX MICROSCOPIC - Abnormal; Notable for the following  components:   Specific Gravity, Urine 1.039 (*)    Protein, ur 100 (*)    All other components within normal limits  LIPASE, BLOOD  PREGNANCY, URINE    EKG None  Radiology CT Abdomen Pelvis W Contrast  Result Date: 07/30/2022 CLINICAL DATA:  Acute nonlocalized abdominal pain. EXAM: CT ABDOMEN AND PELVIS WITH CONTRAST TECHNIQUE: Multidetector CT imaging of the abdomen and pelvis was performed using the standard protocol following bolus administration of intravenous contrast. RADIATION DOSE REDUCTION: This exam was performed according to the departmental dose-optimization program which includes automated exposure control, adjustment of the mA and/or kV according to patient size and/or use of iterative reconstruction technique. CONTRAST:  OMNIPAQUE IOHEXOL 300 MG/ML  SOLN COMPARISON:  None Available. FINDINGS: Lower chest: No acute  abnormality. Hepatobiliary: No suspicious hepatic lesion. Cholelithiasis without evidence of acute cholecystitis. No biliary ductal dilation. Pancreas: No pancreatic ductal dilation or evidence of acute inflammation. Spleen: No splenomegaly or focal splenic lesion. Adrenals/Urinary Tract: Bilateral adrenal glands appear normal. No hydronephrosis. Kidneys demonstrate symmetric enhancement and excretion of contrast material. Urinary bladder is nondistended limiting evaluation. Stomach/Bowel: No radiopaque enteric contrast material was administered. Stomach is unremarkable for degree of distension. No pathologic dilation of small or large bowel. Normal appendix. Colonic diverticulosis without findings of acute diverticulitis. Short segment loop of small bowel in the left hemiabdomen for instance on image 49/2 and 74/5 which has prominent wall thickening with subtle mural stratification adjacent inflammatory fluid. Vascular/Lymphatic: Normal caliber abdominal aorta. No pathologically enlarged abdominal or pelvic lymph nodes. Reproductive: Uterus and bilateral adnexa are unremarkable in CT appearance for reproductive age female. Other: In addition to the mesenteric fluid along the inflamed loop of small bowel there is perihepatic, perisplenic as well as pelvic free fluid all of which is favored reactive from the bowel process. No pneumoperitoneum. No walled off fluid collections. Musculoskeletal: No acute or significant osseous findings. IMPRESSION: 1. Prominent wall thickening with subtle mural stratification involving a short segment loop of small bowel in the left hemiabdomen with adjacent free fluid, most consistent with a focal enteritis. No evidence of bowel obstruction. 2. Small volume likely reactive perihepatic, perisplenic and pelvic free fluid. 3. Colonic diverticulosis without findings of acute diverticulitis. 4. Cholelithiasis without findings of acute cholecystitis. Electronically Signed   By: Maudry Mayhew M.D.   On: 07/30/2022 16:48    Procedures Procedures    Medications Ordered in ED Medications  sodium chloride 0.9 % bolus 1,000 mL (0 mLs Intravenous Stopped 07/30/22 1735)  ondansetron (ZOFRAN) injection 4 mg (4 mg Intravenous Given 07/30/22 1600)  morphine (PF) 4 MG/ML injection 4 mg (4 mg Intravenous Given 07/30/22 1610)  iohexol (OMNIPAQUE) 300 MG/ML solution 100 mL (100 mLs Intravenous Contrast Given 07/30/22 1631)  ketorolac (TORADOL) 30 MG/ML injection 30 mg (30 mg Intravenous Given 07/30/22 2006)    ED Course/ Medical Decision Making/ A&P                           Medical Decision Making Amount and/or Complexity of Data Reviewed Labs: ordered. Radiology: ordered.  Risk Prescription drug management.   This patient presents to the ED for concern of abdominal pain, this involves a number of treatment options, and is a complaint that carries with it a high risk of complications and morbidity.  The differential diagnosis includes pancreatitis, gastroenteritis, gallbladder pathology, PUD and diverticulitis.   Co morbidities: Discussed in HPI    EMR reviewed including pt  PMHx, past surgical history and past visits to ER.   See HPI for more details   Lab Tests:   I ordered and independently interpreted labs. Labs notable for leukocytosis   Imaging Studies:  Abnormal findings. I personally reviewed all imaging studies. Imaging notable for 1. Prominent wall thickening with subtle mural stratification  involving a short segment loop of small bowel in the left  hemiabdomen with adjacent free fluid, most consistent with a focal  enteritis. No evidence of bowel obstruction.  2. Small volume likely reactive perihepatic, perisplenic and pelvic  free fluid.  3. Colonic diverticulosis without findings of acute diverticulitis.  4. Cholelithiasis without findings of acute cholecystitis.      Cardiac Monitoring:  The patient was maintained on a cardiac monitor.  I  personally viewed and interpreted the cardiac monitored which showed an underlying rhythm of: NSR NA   Medicines ordered:  I ordered medication including normal saline bolus fluid resuscitation, morphine and Toradol for pain, and Zofran for nausea. Reevaluation of the patient after these medicines showed that the patient improved I have reviewed the patients home medicines and have made adjustments as needed    Consults/Attending Physician   I discussed this case with my attending physician who cosigned this note including patient's presenting symptoms, physical exam, and planned diagnostics and interventions. Attending physician stated agreement with plan or made changes to plan which were implemented.   Reevaluation:  After the interventions noted above I re-evaluated patient and found that they have :improved    Problem List / ED Course: Patient presented for abdominal pain.  CT scan revealed concerns for ongoing enteritis of the small bowel.  Labs also revealed leukocytosis.  Otherwise patient with stable vitals, afebrile.  Pain did improve during encounter but is ongoing.  Given findings CT and ongoing leukocytosis with abdominal pain, repeat labs and follow-up with PCP are indicated next week.  Discussed this plan with patient.  Patient is agreeable.  Discussed return precautions.  Recommended Tylenol and ibuprofen for pain as needed.   Dispostion:  After consideration of the diagnostic results and the patients response to treatment, I feel that the patent would benefit from discharge, repeat labs and follow-up with PCP regarding ongoing small bowel enteritis.         Final Clinical Impression(s) / ED Diagnoses Final diagnoses:  Enteritis    Rx / DC Orders ED Discharge Orders     None         Gareth Eagle, PA-C 07/30/22 2013    Terrilee Files, MD 08/02/22 1311

## 2022-07-30 NOTE — ED Triage Notes (Signed)
Abd pain since yesterday thought she was constipated  took a stool softener and it worked states was sweating , pains come and go she states

## 2022-07-30 NOTE — Discharge Instructions (Addendum)
Diagnosis of enteritis of the small bowel which is inflammation of your small intestine.  This is likely the source of your pain.  At this time antibiotics are not indicated given that you do not have a fever you do not have a fast heart rate.  However I do believe that follow-up with PCP regarding your ongoing abdominal pain for repeat labs and reevaluation is warranted next week.  If your abdominal pain worsens, you develop new fever, or new bloody stools, return to the ED for further evaluation.

## 2022-07-30 NOTE — ED Notes (Signed)
Patient transported to CT 

## 2022-09-22 ENCOUNTER — Emergency Department (HOSPITAL_BASED_OUTPATIENT_CLINIC_OR_DEPARTMENT_OTHER)
Admission: EM | Admit: 2022-09-22 | Discharge: 2022-09-22 | Discharge status: Against Medical Advice | Payer: Medicaid Other | Attending: Emergency Medicine | Admitting: Emergency Medicine

## 2022-09-22 ENCOUNTER — Other Ambulatory Visit: Payer: Self-pay

## 2022-09-22 ENCOUNTER — Encounter (HOSPITAL_BASED_OUTPATIENT_CLINIC_OR_DEPARTMENT_OTHER): Payer: Self-pay

## 2022-09-22 DIAGNOSIS — R1030 Lower abdominal pain, unspecified: Secondary | ICD-10-CM | POA: Insufficient documentation

## 2022-09-22 DIAGNOSIS — R112 Nausea with vomiting, unspecified: Secondary | ICD-10-CM | POA: Insufficient documentation

## 2022-09-22 DIAGNOSIS — Z5321 Procedure and treatment not carried out due to patient leaving prior to being seen by health care provider: Secondary | ICD-10-CM | POA: Diagnosis not present

## 2022-09-22 LAB — CBC
HCT: 40.7 % (ref 36.0–46.0)
Hemoglobin: 13.6 g/dL (ref 12.0–15.0)
MCH: 27.4 pg (ref 26.0–34.0)
MCHC: 33.4 g/dL (ref 30.0–36.0)
MCV: 82.1 fL (ref 80.0–100.0)
Platelets: 485 10*3/uL — ABNORMAL HIGH (ref 150–400)
RBC: 4.96 MIL/uL (ref 3.87–5.11)
RDW: 14.3 % (ref 11.5–15.5)
WBC: 16.5 10*3/uL — ABNORMAL HIGH (ref 4.0–10.5)
nRBC: 0 % (ref 0.0–0.2)

## 2022-09-22 LAB — COMPREHENSIVE METABOLIC PANEL
ALT: 30 U/L (ref 0–44)
AST: 18 U/L (ref 15–41)
Albumin: 4.4 g/dL (ref 3.5–5.0)
Alkaline Phosphatase: 51 U/L (ref 38–126)
Anion gap: 13 (ref 5–15)
BUN: 11 mg/dL (ref 6–20)
CO2: 25 mmol/L (ref 22–32)
Calcium: 9.4 mg/dL (ref 8.9–10.3)
Chloride: 103 mmol/L (ref 98–111)
Creatinine, Ser: 0.97 mg/dL (ref 0.44–1.00)
GFR, Estimated: >60 mL/min (ref >60)
Glucose, Bld: 128 mg/dL — ABNORMAL HIGH (ref 70–99)
Potassium: 3.5 mmol/L (ref 3.5–5.1)
Sodium: 141 mmol/L (ref 135–145)
Total Bilirubin: 0.5 mg/dL (ref 0.3–1.2)
Total Protein: 8.1 g/dL (ref 6.5–8.1)

## 2022-09-22 LAB — HCG, SERUM, QUALITATIVE: Preg, Serum: NEGATIVE

## 2022-09-22 LAB — LIPASE, BLOOD: Lipase: 28 U/L (ref 11–51)

## 2022-09-22 NOTE — ED Notes (Signed)
Pt showing to have been removed in Epic at 2249hrs however was never physically noted to have been moved into room by triage staff - This nurse attempted to locate patient in lobby however no answer when attempting to locate patient -- Chanin, RN (charge nurse) was made aware

## 2022-09-22 NOTE — ED Triage Notes (Signed)
Patient here POV from Home.  Endorses Mid/Lower ABD Pain since Yesterday. Associated with N/V. No Diarrhea. No Fevers.   Seen by PCP today and referred to GI Specialist. Given Medication but seeks Evaluation due to lack of Pain Control.   NAD Noted during Triage, A&Ox4. GCS 15. Ambulatory.

## 2023-02-25 IMAGING — US US BREAST*R* LIMITED INC AXILLA
1 series · 13 of 17 positions shown · non-contrast
Comparison: None

CLINICAL DATA: Redness and swelling around the right nipple

EXAM:
ULTRASOUND OF THE right BREAST

[Series 1: us breast ltd uni right inc axilla · 17 acquisitions, 13 frames shown]
[im 1/17]
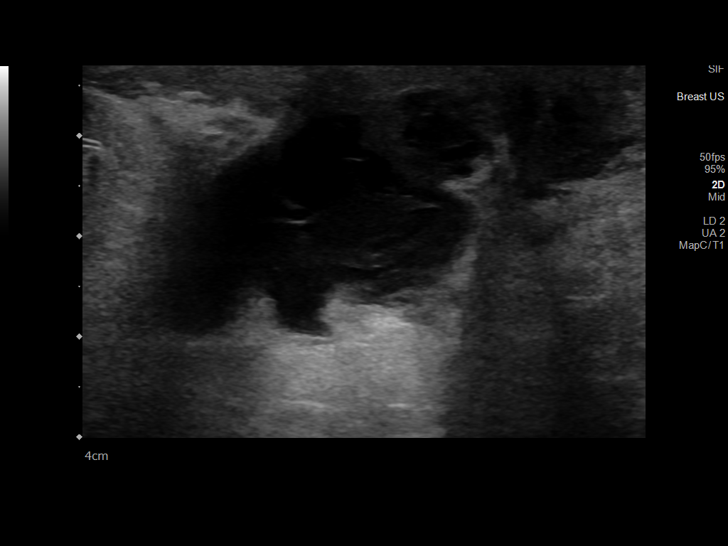
[im 2/17]
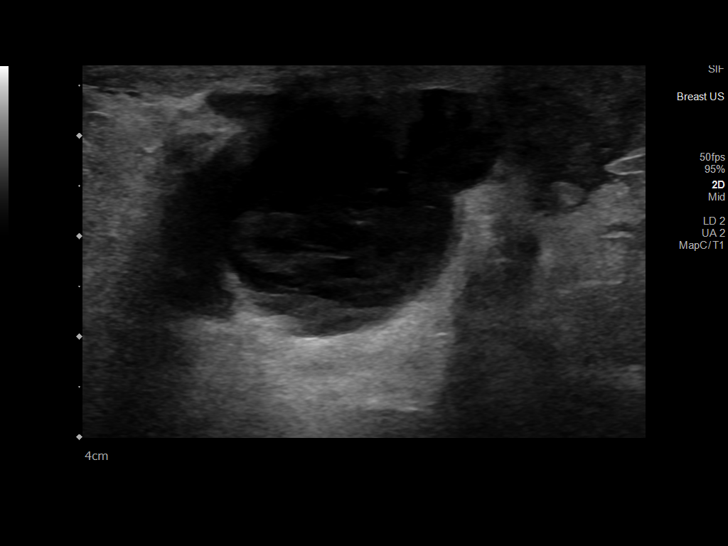
[im 4/17]
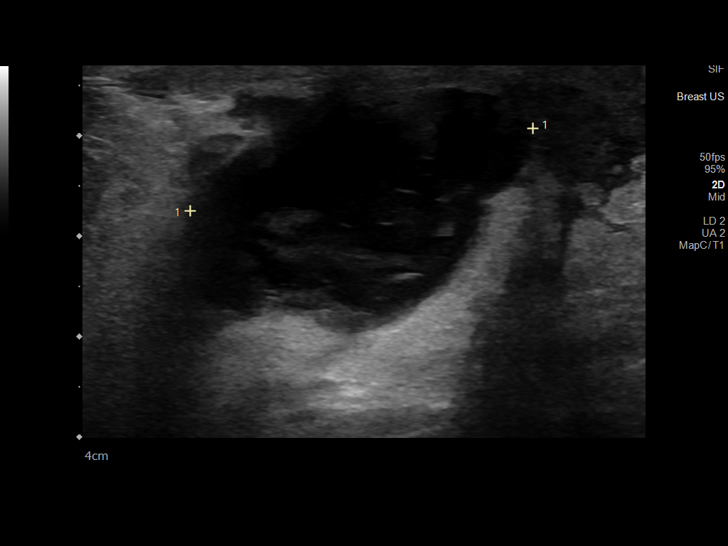
[im 5/17]
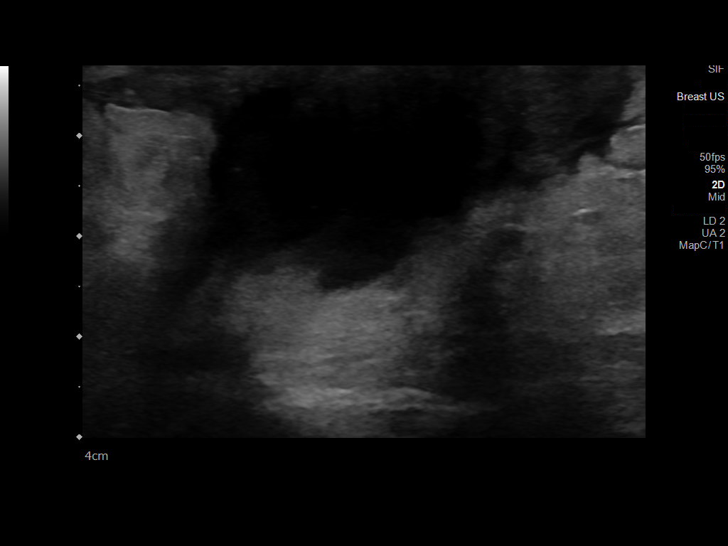
[im 6/17]
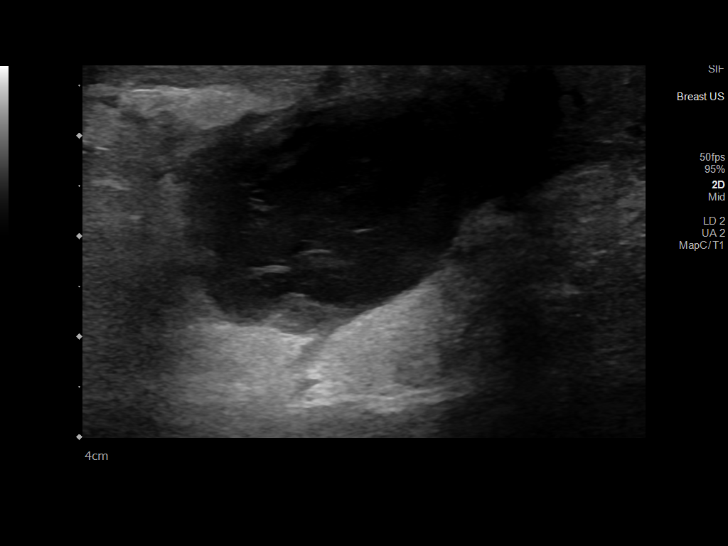
[im 8/17]
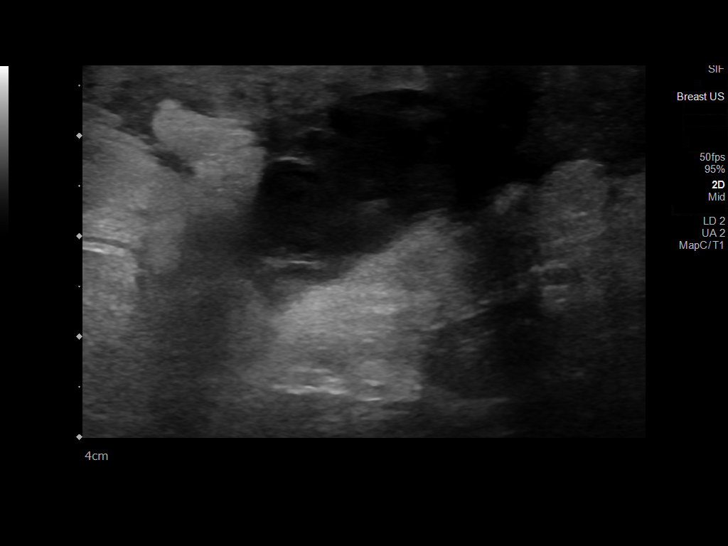
[im 9/17]
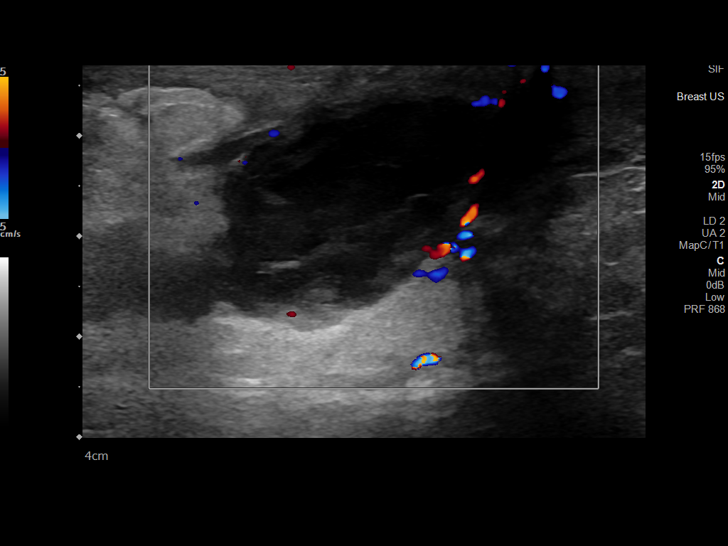
[im 10/17]
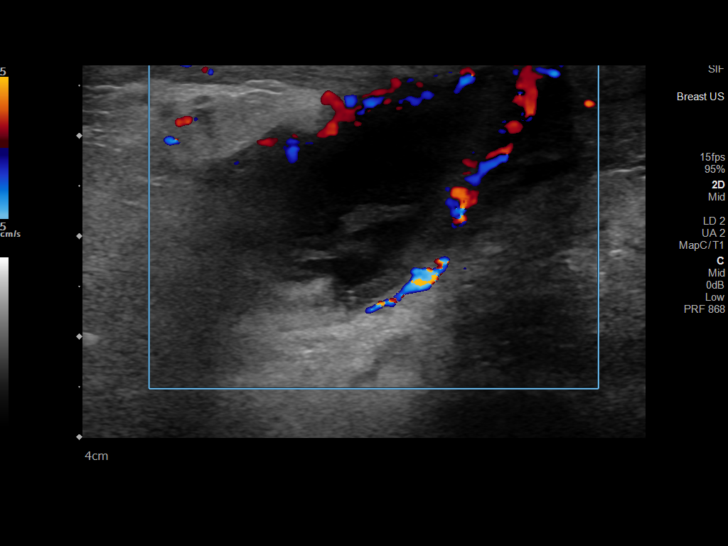
[im 12/17]
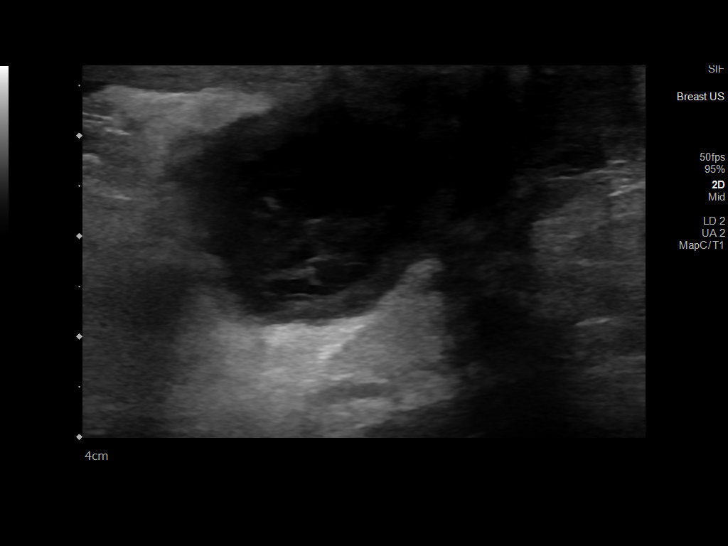
[im 13/17]
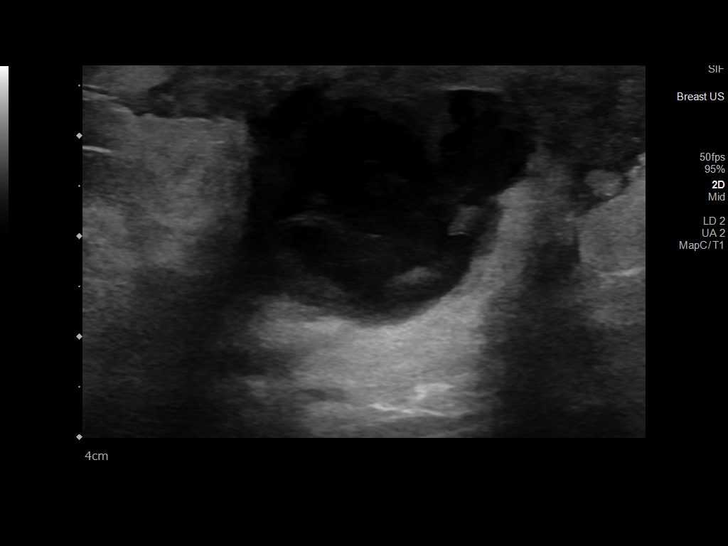
[im 14/17]
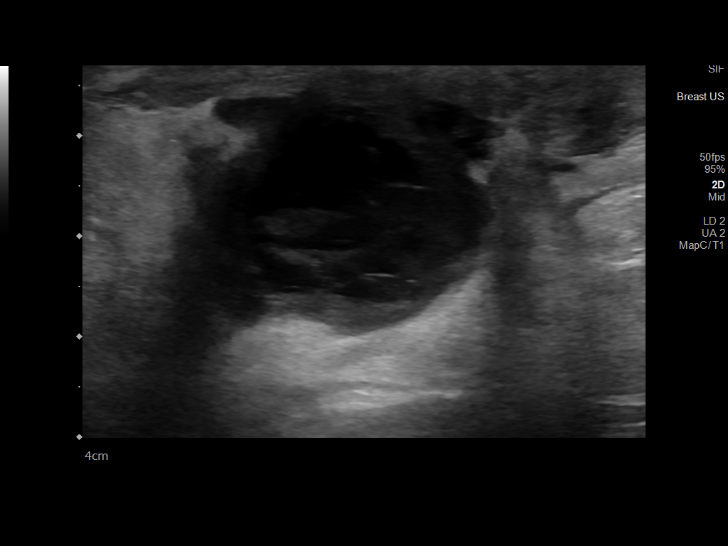
[im 16/17]
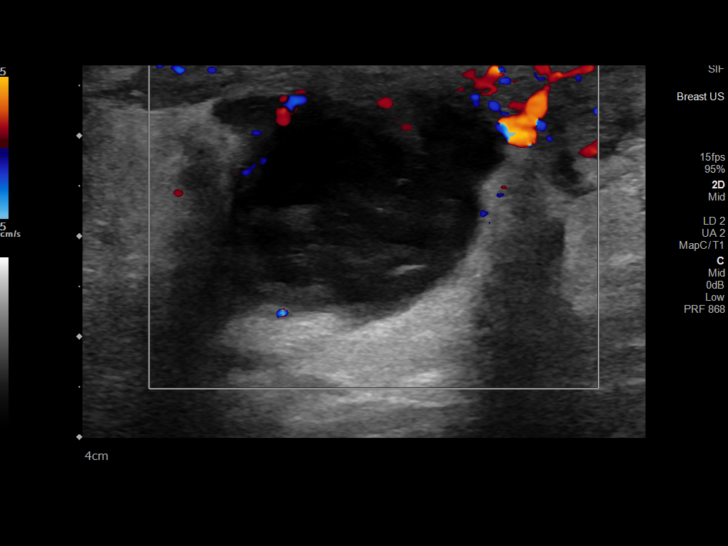
[im 17/17]
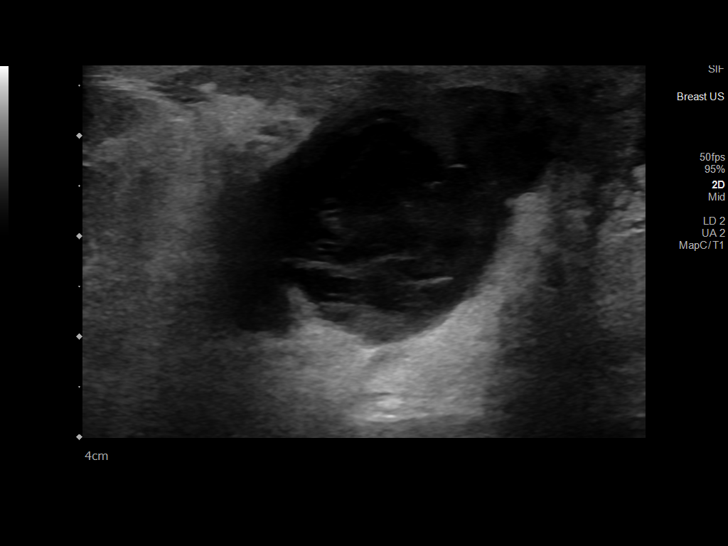

[13 of 17 positions shown; findings below may reference images not displayed]

FINDINGS: Targeted ultrasound is performed, showing echogenic edema within the
retroareolar soft tissues. Irregular complex fluid collection
posterior to the nipple measuring 4.1 x 2 x 3.5 cm.
IMPRESSION: 4.1 cm irregular complex fluid collection posterior to the right
nipple suspicious for an abscess.

RECOMMENDATION:
1. If a breast abscess, or possible abscess, is demonstrated by
ultrasound in the absence of systemic illness, non-intact overlying
skin, or other clinical features requiring hospital admission, it is
recommended that patient be started on an appropriate course of
antibiotics and referred to the [REDACTED]
the following morning for possible abscess drainage.

2. If patient does not have a primary care physician and is to be
referred to the [REDACTED], patient is to
be first referred the following morning to the [HOSPITAL] [REDACTED] who has agreed to aid in the outpatient management

Recommended antibiotics for outpatient treatment of breast abscess:

No MRSA risk

1. Keflex 500 mg po qid
2. Clindamycin 300 mg po tid

MRSA risk

1. Bactrim 2 tabs po bid
2. Doxycycline 100 mg po bid

Subareolar abscess, recurrent abscess, nipple retraction

1. Augmentin 875 mg po bid
2. Clindamycin 300 mg po tid

I have discussed the findings and recommendations with the patient.
If applicable, a reminder letter will be sent to the patient
regarding the next appointment.

BI-RADS CATEGORY  3: Probably benign.

## 2023-04-25 IMAGING — DX DG FOREARM 2V*R*
2 series · 2 of 2 positions shown · non-contrast
Comparison: None Available.

CLINICAL DATA: Fall and trauma to the right upper extremity.

EXAM:
RIGHT HAND - COMPLETE 3+ VIEW; RIGHT FOREARM - 2 VIEW

[forearm ap]
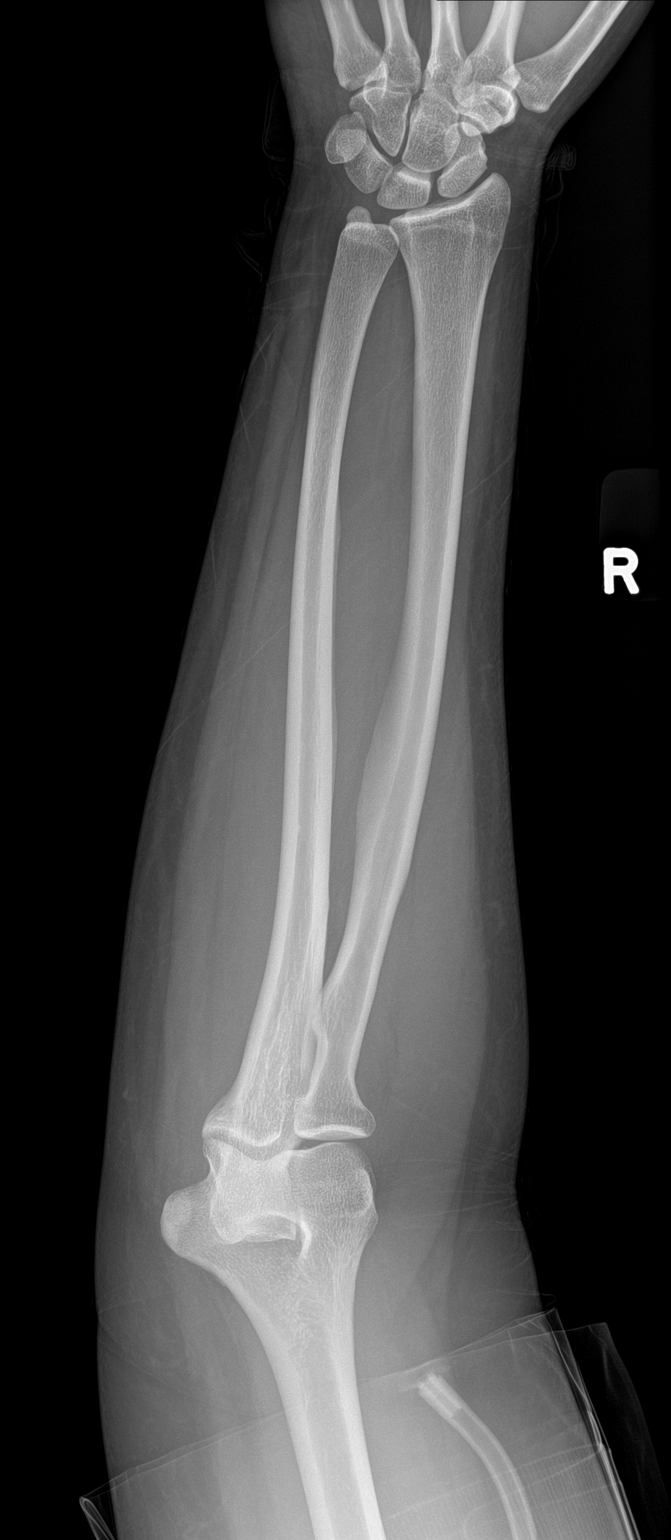

[forearm lat]
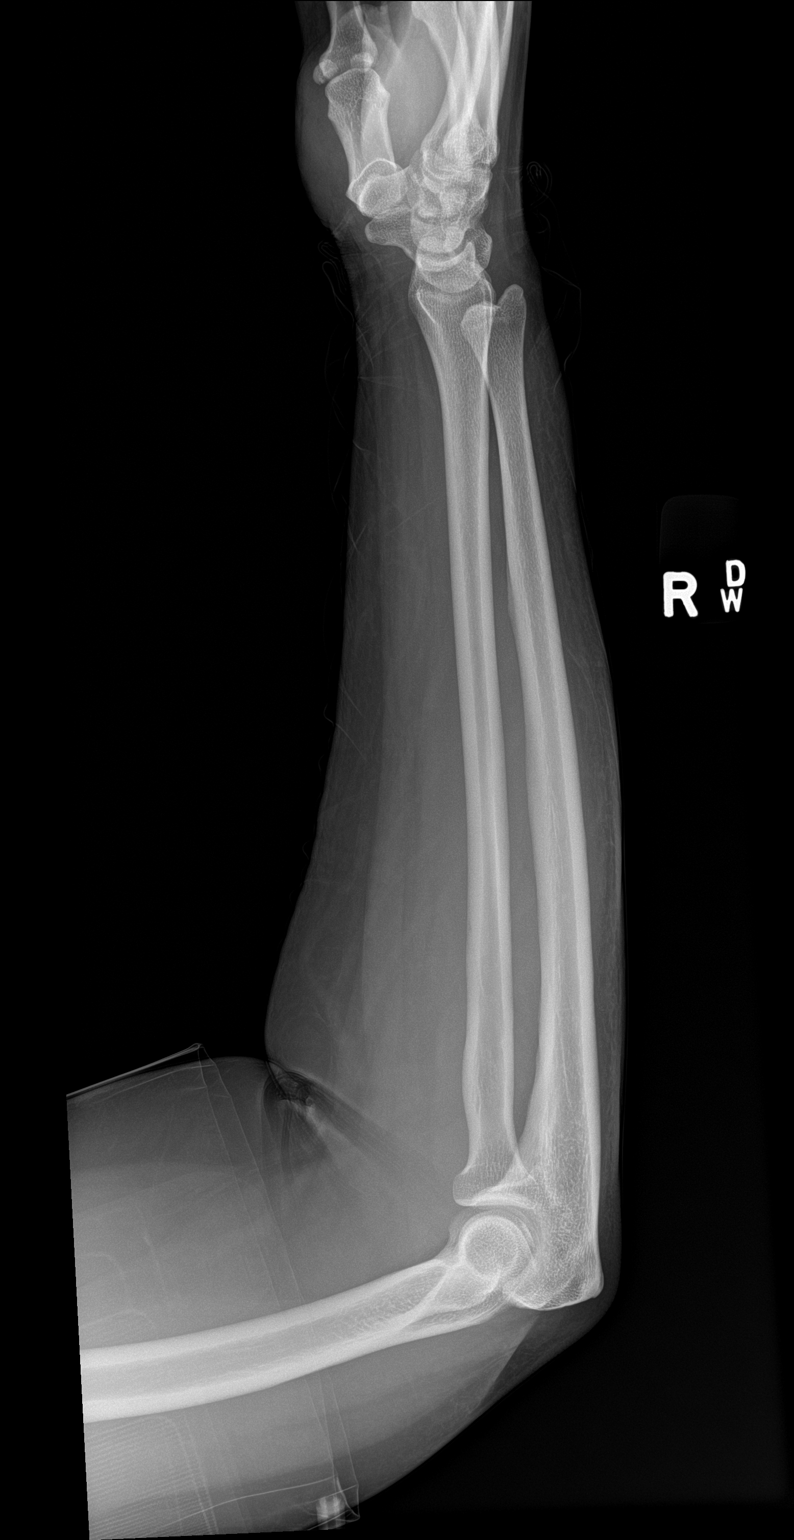

[2 of 2 positions shown; findings below may reference images not displayed]

FINDINGS: There is no evidence of fracture or dislocation. There is no
evidence of arthropathy or other focal bone abnormality. Soft
tissues are unremarkable.
IMPRESSION: Negative.

## 2023-09-19 ENCOUNTER — Encounter (HOSPITAL_BASED_OUTPATIENT_CLINIC_OR_DEPARTMENT_OTHER): Payer: Self-pay

## 2023-09-19 ENCOUNTER — Emergency Department (HOSPITAL_BASED_OUTPATIENT_CLINIC_OR_DEPARTMENT_OTHER): Payer: Medicaid Other

## 2023-09-19 ENCOUNTER — Emergency Department (HOSPITAL_BASED_OUTPATIENT_CLINIC_OR_DEPARTMENT_OTHER)
Admission: EM | Admit: 2023-09-19 | Discharge: 2023-09-19 | Disposition: A | Discharge status: Home / Self Care | Payer: Medicaid Other | Attending: Emergency Medicine | Admitting: Emergency Medicine

## 2023-09-19 DIAGNOSIS — R030 Elevated blood-pressure reading, without diagnosis of hypertension: Secondary | ICD-10-CM

## 2023-09-19 DIAGNOSIS — I10 Essential (primary) hypertension: Secondary | ICD-10-CM | POA: Diagnosis not present

## 2023-09-19 DIAGNOSIS — R519 Headache, unspecified: Secondary | ICD-10-CM | POA: Diagnosis present

## 2023-09-19 DIAGNOSIS — Z79899 Other long term (current) drug therapy: Secondary | ICD-10-CM | POA: Diagnosis not present

## 2023-09-19 DIAGNOSIS — H53149 Visual discomfort, unspecified: Secondary | ICD-10-CM | POA: Diagnosis not present

## 2023-09-19 LAB — TROPONIN I (HIGH SENSITIVITY): Troponin I (High Sensitivity): 3 ng/L (ref <18)

## 2023-09-19 LAB — CBC
HCT: 33 % — ABNORMAL LOW (ref 36.0–46.0)
Hemoglobin: 10.5 g/dL — ABNORMAL LOW (ref 12.0–15.0)
MCH: 26.1 pg (ref 26.0–34.0)
MCHC: 31.8 g/dL (ref 30.0–36.0)
MCV: 81.9 fL (ref 80.0–100.0)
Platelets: 390 10*3/uL (ref 150–400)
RBC: 4.03 MIL/uL (ref 3.87–5.11)
RDW: 15.4 % (ref 11.5–15.5)
WBC: 12.8 10*3/uL — ABNORMAL HIGH (ref 4.0–10.5)
nRBC: 0 % (ref 0.0–0.2)

## 2023-09-19 LAB — BASIC METABOLIC PANEL
Anion gap: 6 (ref 5–15)
BUN: 10 mg/dL (ref 6–20)
CO2: 30 mmol/L (ref 22–32)
Calcium: 9.6 mg/dL (ref 8.9–10.3)
Chloride: 102 mmol/L (ref 98–111)
Creatinine, Ser: 1.02 mg/dL — ABNORMAL HIGH (ref 0.44–1.00)
GFR, Estimated: >60 mL/min (ref >60)
Glucose, Bld: 89 mg/dL (ref 70–99)
Potassium: 3.7 mmol/L (ref 3.5–5.1)
Sodium: 138 mmol/L (ref 135–145)

## 2023-09-19 LAB — PREGNANCY, URINE: Preg Test, Ur: NEGATIVE

## 2023-09-19 MED ORDER — IOHEXOL 350 MG/ML SOLN
100.0000 mL | Freq: Once | INTRAVENOUS | Status: AC | PRN
  Administered 2023-09-19: 75 mL via INTRAVENOUS

## 2023-09-19 MED ORDER — HYDRALAZINE HCL 20 MG/ML IJ SOLN
5.0000 mg | Freq: Once | INTRAMUSCULAR | Status: AC
  Administered 2023-09-19: 5 mg via INTRAVENOUS
  Filled 2023-09-19: qty 1

## 2023-09-19 MED ORDER — DIPHENHYDRAMINE HCL 50 MG/ML IJ SOLN
25.0000 mg | Freq: Once | INTRAMUSCULAR | Status: AC
Start: 2023-09-19 — End: 2023-09-19
  Administered 2023-09-19: 25 mg via INTRAVENOUS
  Filled 2023-09-19: qty 1

## 2023-09-19 MED ORDER — AMLODIPINE BESYLATE 5 MG PO TABS: 5.0000 mg | ORAL_TABLET | Freq: Every day | ORAL | 0 refills | Status: AC

## 2023-09-19 MED ORDER — KETOROLAC TROMETHAMINE 30 MG/ML IJ SOLN
15.0000 mg | Freq: Once | INTRAMUSCULAR | Status: AC
  Administered 2023-09-19: 15 mg via INTRAVENOUS
  Filled 2023-09-19: qty 1

## 2023-09-19 MED ORDER — AMLODIPINE BESYLATE 5 MG PO TABS
5.0000 mg | ORAL_TABLET | Freq: Once | ORAL | Status: AC
  Administered 2023-09-19: 5 mg via ORAL
  Filled 2023-09-19: qty 1

## 2023-09-19 MED ORDER — METOCLOPRAMIDE HCL 5 MG/ML IJ SOLN
10.0000 mg | Freq: Once | INTRAMUSCULAR | Status: AC
  Administered 2023-09-19: 10 mg via INTRAVENOUS
  Filled 2023-09-19: qty 2

## 2023-09-19 NOTE — ED Triage Notes (Signed)
Pt to triage c/o headache x 3 days with HTN. Pt denies CP or  injury presents a/o x 4. Current BP 240/143 Pt on room air. EKG completes labs drawn IV established.

## 2023-09-19 NOTE — Discharge Instructions (Addendum)
Take the blood pressure medication as prescribed and keep a record of your blood pressure on a regular basis.  Follow-up with the primary doctor for further evaluation of your elevated blood pressure readings and need for additional medications.  Return to the ED with worsening headache, difficulty speaking, chest pain, shortness of breath, unilateral weakness, numbness, tingling or other concerns.  Thank you for the opportunity to take care of you in our Emergency Department. You have been diagnosed with high blood pressure, also known as hypertension. This means that the force of blood against the walls of your blood vessels called is too strong. It also means that your heart has to work harder to move the blood. High blood pressure usually has no symptoms, but over time, it can cause serious health problems such as Heart attack and heart failure Stroke Kidney disease and failure Vision loss With the help from your healthcare provider and some important life style changes, you can manage your blood pressure and protect your health. Please read the instructions provided on hypertension, how to manage it and how to check your blood pressure. Additionally, use the blood pressure log provided to record your blood pressures. Take the blood pressure log with you to your primary care doctor so that they can adjust your blood pressure medications if needed. Please read the instructions on follow-up appointment. Return to the ER or Call 911 right away if you have any of these symptoms: Chest pain or shortness of breath Severe headache Weakness, tingling, or numbness of your face, arms, or legs (especially on 1 side of the body) Sudden change in vision Confusion, trouble speaking, or trouble understanding speech

## 2023-09-19 NOTE — ED Provider Notes (Signed)
[place] EMERGENCY DEPARTMENT AT [facility] Provider Note   CSN: [id number] Arrival date & time: 09/19/23  0208     History  Chief Complaint  Patient presents with   Headache    Michele Richardson is a 32 y.o. female.  Patient presents with elevated blood pressure and headache.  States she has developed a progressively worsening frontal headache over the past 3 days.  Denies thunderclap onset.  Frontal headache associated with light sensitivity.  No visual changes.  No fever, chills, nausea, vomiting, chest pain or shortness of breath.  No focal weakness, numbness or tingling. Has never had this kind of headache previously.  Family became concerned about her blood pressure and she checked it at home and it was over 200 systolic.  States she is not on blood pressure medications but was on them several years ago before her breast reduction surgery but stopped taking them after about a month and was not told that she needed to have them long-term. She is not on any prescription medications currently.  Has not had any blood pressure medications since 2022. Does not check her blood pressure on a regular basis.  The history is provided by the patient.  Headache Associated symptoms: photophobia   Associated symptoms: no abdominal pain, no congestion, no cough, no dizziness, no fever, no myalgias, no nausea, no vomiting and no weakness        Home Medications Prior to Admission medications   Medication Sig Start Date End Date Taking? Authorizing Provider  FLUoxetine (PROZAC) 40 MG capsule Take 40 mg by mouth daily.    [provider]  hydrOXYzine (ATARAX/VISTARIL) 25 MG tablet Take 1 tablet (25 mg total) by mouth every 6 (six) hours. Patient taking differently: Take 25 mg by mouth daily as needed for anxiety or itching. 09/16/21   Couture, Cortni S, PA-C  lisinopril-hydrochlorothiazide (ZESTORETIC) 20-12.5 MG tablet Take 1 tablet by mouth daily.    [provider]      Allergies    Shellfish allergy, Egg-derived products, Nutritional supplements, Other, and Sulfa antibiotics    Review of Systems   Review of Systems  Constitutional:  Negative for activity change, appetite change and fever.  HENT:  Negative for congestion and rhinorrhea.   Eyes:  Positive for photophobia. Negative for visual disturbance.  Respiratory:  Negative for cough, chest tightness and shortness of breath.   Cardiovascular:  Negative for chest pain.  Gastrointestinal:  Negative for abdominal pain, nausea and vomiting.  Genitourinary:  Negative for dysuria and hematuria.  Musculoskeletal:  Negative for arthralgias and myalgias.  Skin:  Negative for rash.  Neurological:  Positive for headaches. Negative for dizziness, weakness and light-headedness.   all other systems are negative except as noted in the HPI and PMH.    Physical Exam Updated Vital Signs BP (!) 200/143 (BP Location: Right Arm)   Pulse 83   Temp 98.3 F (36.8 C) (Oral)   Resp 18   Wt 120.5 kg   LMP 09/03/2023 (Exact Date)   SpO2 99%   BMI 41.62 kg/m  Physical Exam Vitals and nursing note reviewed.  Constitutional:      General: She is not in acute distress.    Appearance: She is well-developed.  HENT:     Head: Normocephalic and atraumatic.     Mouth/Throat:     Pharynx: No oropharyngeal exudate.  Eyes:     Conjunctiva/sclera: Conjunctivae normal.     Pupils: Pupils are equal, round, and reactive to  light.  Neck:     Comments: No meningismus. Cardiovascular:     Rate and Rhythm: Normal rate and regular rhythm.     Heart sounds: Normal heart sounds. No murmur heard. Pulmonary:     Effort: Pulmonary effort is normal. No respiratory distress.     Breath sounds: Normal breath sounds.  Abdominal:     Palpations: Abdomen is soft.     Tenderness: There is no abdominal tenderness. There is no guarding or rebound.  Musculoskeletal:        General: No tenderness. Normal range of  motion.     Cervical back: Normal range of motion and neck supple.  Skin:    General: Skin is warm.  Neurological:     Mental Status: She is alert and oriented to person, place, and time.     Cranial Nerves: No cranial nerve deficit.     Motor: No abnormal muscle tone.     Coordination: Coordination normal.     Comments:  5/5 strength throughout. CN 2-12 intact.Equal grip strength.   Psychiatric:        Behavior: Behavior normal.     ED Results / Procedures / Treatments   Labs (all labs ordered are listed, but only abnormal results are displayed) Labs Reviewed  BASIC METABOLIC PANEL - Abnormal; Notable for the following components:      Result Value   Creatinine, Ser 1.02 (*)    All other components within normal limits  CBC - Abnormal; Notable for the following components:   WBC 12.8 (*)    Hemoglobin 10.5 (*)    HCT 33.0 (*)    All other components within normal limits  PREGNANCY, URINE  TROPONIN I (HIGH SENSITIVITY)  TROPONIN I (HIGH SENSITIVITY)    EKG EKG Interpretation Date/Time:  Monday September 19 2023 02:29:50 EDT Ventricular Rate:  79 PR Interval:  148 QRS Duration:  86 QT Interval:  390 QTC Calculation: 447 R Axis:   59  Text Interpretation: Normal sinus rhythm Minimal voltage criteria for LVH, may be normal variant ( Sokolow-Lyon ) Nonspecific T wave abnormality Abnormal ECG When compared with ECG of 16-Sep-2021 12:57, Nonspecific T wave abnormality has replaced inverted T waves in Inferior leads Nonspecific T wave abnormality has replaced inverted T waves in Lateral leads No significant change was found Confirmed by Glynn Octave [phone]) on 09/19/2023 3:28:53 AM  Radiology CT ANGIO HEAD NECK W WO CM  Result Date: 09/19/2023 CLINICAL DATA:  32 year old female with headache for 3 days. Neurologic deficit. EXAM: CT ANGIOGRAPHY HEAD AND NECK WITH AND WITHOUT CONTRAST TECHNIQUE: Multidetector CT imaging of the head and neck was performed using the standard  protocol during bolus administration of intravenous contrast. Multiplanar CT image reconstructions and MIPs were obtained to evaluate the vascular anatomy. Carotid stenosis measurements (when applicable) are obtained utilizing NASCET criteria, using the distal internal carotid diameter as the denominator. RADIATION DOSE REDUCTION: This exam was performed according to the departmental dose-optimization program which includes automated exposure control, adjustment of the mA and/or kV according to patient size and/or use of iterative reconstruction technique. CONTRAST:  75mL OMNIPAQUE IOHEXOL 350 MG/ML SOLN COMPARISON:  Report of previous head CT 12/14/2009 (no images available). FINDINGS: CT HEAD Brain: Normal cerebral volume. No midline shift, ventriculomegaly, mass effect, evidence of mass lesion, intracranial hemorrhage or evidence of cortically based acute infarction. Gray-white matter differentiation is within normal limits throughout the brain. Calvarium and skull base: No acute osseous abnormality identified. Paranasal sinuses: Opacified maxillary sinuses, on  the left related to multiple retention cysts. Mild underlying periosteal thickening. Scattered ethmoid opacification. Frontal and sphenoid sinuses, tympanic cavities and mastoids appear to remain clear. Orbits: Visualized orbits and scalp soft tissues are within normal limits. CTA NECK Skeleton: No acute osseous abnormality identified. Minimal lower cervical disc bulging and calcification. Upper chest: Negative. Visible lungs are clear, negative visible mediastinum. Other neck: Negative. Aortic arch: Normal 3 vessel arch. Right carotid system: Normal. Left carotid system: Normal. Vertebral arteries: Proximal subclavian arteries and vertebral artery origins are normal. Fairly codominant vertebral arteries appear patent and normal to skull base. CTA HEAD Posterior circulation: Patent distal vertebral arteries and vertebrobasilar junction with no plaque or  stenosis. Normal left PICA origin. Right AICA appears dominant. Patent basilar artery without stenosis. Patent SCA and PCA origins. Small left posterior communicating artery, the right is diminutive or absent. Bilateral PCA branches are within normal limits. Anterior circulation: Both ICA siphons appear patent and normal. Normal left posterior communicating artery origin. Patent carotid termini. Patent MCA and ACA origins. Dominant right A1. Normal anterior communicating artery. Bilateral ACA branches are within normal limits. Left MCA M1 segment and trifurcation are patent without stenosis. Right MCA M1 segment and bifurcation are patent without stenosis. Bilateral MCA branches are within normal limits. Venous sinuses: Patent. Anatomic variants: Dominant right ACA A1. Review of the MIP images confirms the above findings IMPRESSION: 1. Negative CTA Head and Neck.  Normal CT appearance of the brain. 2. Chronic appearing maxillary sinusitis. Electronically Signed   By: Odessa Fleming M.D.   On: 09/19/2023 05:10   DG Chest Port 1 View  Result Date: 09/19/2023 CLINICAL DATA:  32 year old female with headache for 3 days. Hypertension. EXAM: PORTABLE CHEST 1 VIEW COMPARISON:  Chest radiographs 09/16/2021. FINDINGS: Portable AP view at 0402 hours. Cardiac size is at the upper limits of normal. Other mediastinal contours are within normal limits. Visualized tracheal air column is within normal limits. Allowing for portable technique the lungs are clear. No pneumothorax or pleural effusion. Paucity of bowel gas in the visible abdomen. No osseous abnormality identified. IMPRESSION: Negative portable chest. Electronically Signed   By: Odessa Fleming M.D.   On: 09/19/2023 04:17    Procedures Procedures    Medications Ordered in ED Medications  ketorolac (TORADOL) 30 MG/ML injection 15 mg (has no administration in time range)  metoCLOPramide (REGLAN) injection 10 mg (10 mg Intravenous Given 09/19/23 0357)  diphenhydrAMINE  (BENADRYL) injection 25 mg (25 mg Intravenous Given 09/19/23 0357)  hydrALAZINE (APRESOLINE) injection 5 mg (5 mg Intravenous Given 09/19/23 0357)    ED Course/ Medical Decision Making/ A&P                                 Medical Decision Making Amount and/or Complexity of Data Reviewed Labs: ordered. Decision-making details documented in ED Course. Radiology: ordered and independent interpretation performed. Decision-making details documented in ED Course. ECG/medicine tests: ordered and independent interpretation performed. Decision-making details documented in ED Course.  Risk Prescription drug management.   3 days of gradual onset progressively worsening headache with elevated blood pressure.  Neurologic exam is nonfocal.  Denies thunderclap onset.  Low suspicion for subarachnoid hemorrhage, meningitis, temporal arteritis.  Elevated blood pressure likely contributing to her headache.  Labs show no evidence of endorgan damage.  Creatinine is normal.  Troponin is negative.  Patient given IV Toradol, Reglan, Benadryl and hydralazine for her blood pressure and headache.  CT head is negative for hemorrhage or mass.  CTA negative for aneurysm or significant vascular stenosis.  Results reviewed and interpreted by me.  Blood pressure has improved to 172/109.  Headache has resolved.  No chest pain.  Will initiate amlodipine.  Needs follow-up with PCP regarding elevated blood pressure for further medication adjustments.  Advised to keep a record of blood pressure and check it on a regular basis.  Follow-up with PCP for medication adjustments.  Return to the ED with worsening headache, chest pain, visual changes, unilateral weakness, numbness, tingling, any other concerns.        Final Clinical Impression(s) / ED Diagnoses Final diagnoses:  Bad headache  Elevated blood pressure reading    Rx / DC Orders ED Discharge Orders     None         Canaan Prue, Jeannett Senior, MD 09/19/23  [phone]

## 2023-09-20 ENCOUNTER — Telehealth: Payer: Self-pay

## 2023-09-20 NOTE — Progress Notes (Signed)
   Care Guide Note  09/20/2023 Name: Michele Richardson MRN: [id number] DOB: 1991/07/20  Referred by: Patient, No Pcp Per Reason for referral : Care Coordination (Outreach to schedule with Pharm d )   [name] is a 32 y.o. year old female who is a primary care patient of Patient, No Pcp Per. Adalia Chabolla was referred to the pharmacist for assistance related to HTN.    Successful contact was made with the patient to discuss pharmacy services including being ready for the pharmacist to call at least 5 minutes before the scheduled appointment time, to have medication bottles and any blood sugar or blood pressure readings ready for review. The patient agreed to meet with the pharmacist via with the pharmacist via telephone visit on (date/time).  09/27/2023  Penne Lash, RMA Care Guide [facility]  [place], [place] [address] Direct Dial: [phone] [email or web address]

## 2023-09-27 ENCOUNTER — Other Ambulatory Visit: Payer: Medicaid Other | Admitting: Pharmacist

## 2023-09-27 NOTE — Progress Notes (Signed)
09/27/2023 Name: Michele Richardson MRN: [id number] DOB: 26-Dec-1990  Chief Complaint  Patient presents with   Hypertension    Michele Richardson is a 32 y.o. year old female who presented for a telephone visit.   They were referred to the pharmacist by  ED provider  for assistance in managing hypertension.    Subjective:  Care Team: Primary Care Provider: Patient, No Pcp Per ; Next Scheduled Visit: Pending Clinical Pharmacist: Marlowe Aschoff, PharmD  Medication Access/Adherence  Current Pharmacy:  [facility] [phone] - [place], [place] - [address] AT [facility] [address] [facility] [address] [place] [address] Phone: [phone] Fax: [phone]  [id number] - [place],  - [address] AT [facility] [address] [place] [address] Phone: [phone] Fax: [phone]   Patient reports affordability concerns with their medications: No  Patient reports access/transportation concerns to their pharmacy: No  Patient reports adherence concerns with their medications:  No     Hypertension:  Current medications: Amlodipine 5mg  daily Medications previously tried: Lisinopril-hydrochlorothiazide 20-25mg  (prior to breast reduction surgery and discontinued after surgery was complete; no side effects)  Patient has a validated, automated, upper arm home BP cuff (cuff is 34 years old) Current blood pressure readings readings: Was systolic 160-170's since February 2024; 218/144 the night she decided to go to ED on 10/28 for HTN with associated headache (pulse in 80's)  11/1: 158/120 11/3: 156/116  Patient denies hypotensive s/sx including dizziness, lightheadedness.  Patient denies hypertensive symptoms including headache, chest pain, shortness of breath  Current meal patterns: "Eats whatever she wants", but aware to monitor sodium in foods  Current physical activity: None- wants to start  using her walking pad   Objective:  No results found for: "HGBA1C"  Lab Results  Component Value Date   CREATININE 1.02 (H) 09/19/2023   BUN 10 09/19/2023   NA 138 09/19/2023   K 3.7 09/19/2023   CL 102 09/19/2023   CO2 30 09/19/2023    No results found for: "CHOL", "HDL", "LDLCALC", "LDLDIRECT", "TRIG", "CHOLHDL"  Medications Reviewed Today     Reviewed by Pollie Friar, RPH (Pharmacist) on 09/27/23 at 1334  Med List Status: <None>   Medication Order Taking? Sig Documenting Provider Last Dose Status Informant  amLODipine (NORVASC) 5 MG tablet [id number] Yes Take 1 tablet (5 mg total) by mouth daily. Glynn Octave, MD Taking Active   lisinopril-hydrochlorothiazide (ZESTORETIC) 20-12.5 MG tablet [id number] No Take 1 tablet by mouth daily.  Patient not taking: Reported on 09/27/2023   [provider] Not Taking Active Self              Assessment/Plan:   Hypertension: - Currently uncontrolled - Reviewed long term cardiovascular and renal outcomes of uncontrolled blood pressure - Reviewed appropriate blood pressure monitoring technique and reviewed goal blood pressure. Recommended to check home blood pressure and heart rate daily at 1-2 hours after taking BP medication - Recommend to reach BP goal of <130/80  - Monitor sodium intake     **Follow Up Plan:**  Follow-up call in 1 month to assess BP at that time and if PCP established  - Will discuss weight gain concerns at that time 2. Called OB/GYN and LVM to request if that Dr will send a month supply of Lisinopril-hydrochlorothiazide 20-25mg  to get restarted currently  - Received call back that they would need a physical examination to start that type of  medication- unable to prescribe at this time 3. Sent MyChart message with list of [facility] offices currently accepting patients  Marlowe Aschoff, PharmD [facility] Phone Number: [phone]

## 2023-10-27 ENCOUNTER — Telehealth: Payer: Self-pay | Admitting: Pharmacist

## 2023-10-27 ENCOUNTER — Other Ambulatory Visit: Payer: Self-pay | Admitting: Pharmacist

## 2023-10-27 NOTE — Progress Notes (Signed)
   10/27/2023  Patient ID: Michele Richardson, female   DOB: 07-Mar-1991, 32 y.o.   MRN: [id number]  Tried calling Michele Richardson regarding BP follow-up and addressing any issues of getting a PCP set-up. Unable to reach at this time. Left a HIPAA compliant message requesting a call back.    Marlowe Aschoff, PharmD [facility] Phone Number: [phone]

## 2023-11-01 ENCOUNTER — Telehealth: Payer: Self-pay | Admitting: Pharmacist

## 2023-11-01 NOTE — Progress Notes (Signed)
   11/01/2023  Patient ID: Michele Richardson, female   DOB: 1991/08/26, 32 y.o.   MRN: [id number]  Tried calling Joanie regarding BP follow-up and addressing any issues of getting a PCP set-up. Unable to reach at this time for a second attempt. Left a HIPAA compliant message requesting a call back.    Marlowe Aschoff, PharmD [facility] Phone Number: [phone]

## 2023-11-09 ENCOUNTER — Telehealth: Payer: Self-pay | Admitting: Pharmacist

## 2023-11-09 NOTE — Progress Notes (Signed)
   11/09/2023  Patient ID: Michele Richardson, female   DOB: [birth year], 32 y.o.   MRN: [id number]  Tried calling patient for third and final attempt after ED visit for hypertension in hopes of getting established with a PCP. Unable to reach the patient or proceed at this time.    Marlowe Aschoff, PharmD [facility] Phone Number: [phone]

## 2024-01-07 ENCOUNTER — Other Ambulatory Visit: Payer: Self-pay

## 2024-01-07 ENCOUNTER — Emergency Department (HOSPITAL_BASED_OUTPATIENT_CLINIC_OR_DEPARTMENT_OTHER)
Admission: EM | Admit: 2024-01-07 | Discharge: 2024-01-08 | Disposition: A | Discharge status: Home / Self Care | Payer: Medicaid Other | Attending: Emergency Medicine | Admitting: Emergency Medicine

## 2024-01-07 ENCOUNTER — Encounter (HOSPITAL_BASED_OUTPATIENT_CLINIC_OR_DEPARTMENT_OTHER): Payer: Self-pay | Admitting: Radiology

## 2024-01-07 DIAGNOSIS — I10 Essential (primary) hypertension: Secondary | ICD-10-CM | POA: Diagnosis not present

## 2024-01-07 DIAGNOSIS — J069 Acute upper respiratory infection, unspecified: Secondary | ICD-10-CM | POA: Diagnosis not present

## 2024-01-07 DIAGNOSIS — R059 Cough, unspecified: Secondary | ICD-10-CM | POA: Diagnosis present

## 2024-01-07 DIAGNOSIS — Z79899 Other long term (current) drug therapy: Secondary | ICD-10-CM | POA: Diagnosis not present

## 2024-01-07 LAB — URINALYSIS, ROUTINE W REFLEX MICROSCOPIC
Bilirubin Urine: NEGATIVE
Glucose, UA: NEGATIVE mg/dL
Hgb urine dipstick: NEGATIVE
Ketones, ur: NEGATIVE mg/dL
Leukocytes,Ua: NEGATIVE
Nitrite: NEGATIVE
Protein, ur: 30 mg/dL — AB
Specific Gravity, Urine: 1.035 — ABNORMAL HIGH (ref 1.005–1.030)
pH: 6 (ref 5.0–8.0)

## 2024-01-07 LAB — RESP PANEL BY RT-PCR (RSV, FLU A&B, COVID)  RVPGX2
Influenza A by PCR: NEGATIVE
Influenza B by PCR: NEGATIVE
Resp Syncytial Virus by PCR: NEGATIVE
SARS Coronavirus 2 by RT PCR: NEGATIVE

## 2024-01-07 LAB — PREGNANCY, URINE: Preg Test, Ur: NEGATIVE

## 2024-01-07 NOTE — ED Triage Notes (Signed)
Pt also states that she passed a clot with her urine. Pt states it isnt time for her period and it was only a clot. Pt states her urine is strong and she has spent time in a hot tube and swimming pool.

## 2024-01-07 NOTE — ED Triage Notes (Signed)
Pt states she has been on a cruise for the past week and now she has a cough she can't get rid of and just feels all around weak.

## 2024-01-08 ENCOUNTER — Emergency Department (HOSPITAL_BASED_OUTPATIENT_CLINIC_OR_DEPARTMENT_OTHER): Payer: Medicaid Other | Admitting: Radiology

## 2024-01-08 MED ORDER — DEXTROMETHORPHAN POLISTIREX ER 30 MG/5ML PO SUER: 30.0000 mg | Freq: Once | ORAL | Status: DC

## 2024-01-08 MED ORDER — GUAIFENESIN-DM 100-10 MG/5ML PO SYRP
5.0000 mL | ORAL_SOLUTION | Freq: Once | ORAL | Status: AC
Start: 2024-01-08 — End: 2024-01-08
  Administered 2024-01-08: 5 mL via ORAL
  Filled 2024-01-08: qty 5

## 2024-01-08 MED ORDER — KETOROLAC TROMETHAMINE 60 MG/2ML IM SOLN
30.0000 mg | Freq: Once | INTRAMUSCULAR | Status: AC
  Administered 2024-01-08: 30 mg via INTRAMUSCULAR
  Filled 2024-01-08: qty 2

## 2024-01-08 NOTE — ED Provider Notes (Incomplete)
[place] EMERGENCY DEPARTMENT AT [facility] Provider Note  CSN: [id number] Arrival date & time: 01/07/24 2143  Chief Complaint(s) Cough  HPI Michele Richardson is a 33 y.o. female {Add pertinent medical, surgical, social history, OB history to HPI:1}    Cough Cough characteristics:  Productive Sputum characteristics:  Green Severity:  Moderate Onset quality:  Gradual Duration:  6 days Timing:  Constant Progression:  Waxing and waning Chronicity:  New Context: sick contacts   Context comment:  Recent travel to [place] Relieved by:  Nothing Worsened by:  Nothing Associated symptoms: chills, fever, myalgias, rhinorrhea, sinus congestion and sore throat     Past Medical History Past Medical History:  Diagnosis Date   Anxiety    Hypertension    Low iron    "not anemic" per patient   Patient Active Problem List   Diagnosis Date Noted   Status post incision and drainage 03/05/2022   Anemia affecting pregnancy in third trimester 11/29/2019   Gestational diabetes mellitus (GDM) in third trimester controlled on oral hypoglycemic drug 11/29/2019   Home Medication(s) Prior to Admission medications   Medication Sig Start Date End Date Taking? Authorizing Provider  amLODipine (NORVASC) 5 MG tablet Take 1 tablet (5 mg total) by mouth daily. 09/19/23   Rancour, Jeannett Senior, MD  lisinopril-hydrochlorothiazide (ZESTORETIC) 20-12.5 MG tablet Take 1 tablet by mouth daily. Patient not taking: Reported on 09/27/2023    [provider]                                                                                                                                    Allergies Shellfish allergy, Egg-derived products, Nutritional supplements, Other, and Sulfa antibiotics  Review of Systems Review of Systems  Constitutional:  Positive for chills and fever.  HENT:  Positive for rhinorrhea and sore throat.   Respiratory:  Positive for cough.   Musculoskeletal:  Positive  for myalgias.   As noted in HPI  Physical Exam Vital Signs  I have reviewed the triage vital signs BP (!) 198/143 (BP Location: Right Arm)   Pulse 100   Temp 99 F (37.2 C) (Tympanic)   Resp 20   Ht 5\' 7"  (1.702 m)   Wt 117.9 kg   SpO2 100%   BMI 40.72 kg/m  *** Physical Exam Vitals reviewed.  Constitutional:      General: She is not in acute distress.    Appearance: She is well-developed. She is not diaphoretic.  HENT:     Head: Normocephalic and atraumatic.     Right Ear: Tympanic membrane normal.     Left Ear: Tympanic membrane normal.     Nose: Congestion present.     Mouth/Throat:     Pharynx: Postnasal drip present.  Eyes:     General: No scleral icterus.       Right eye: No discharge.        Left eye: No discharge.  Conjunctiva/sclera: Conjunctivae normal.     Pupils: Pupils are equal, round, and reactive to light.  Cardiovascular:     Rate and Rhythm: Normal rate and regular rhythm.     Heart sounds: No murmur heard.    No friction rub. No gallop.  Pulmonary:     Effort: Pulmonary effort is normal. No respiratory distress.     Breath sounds: Normal breath sounds. No stridor. No rales.  Abdominal:     General: There is no distension.     Palpations: Abdomen is soft.     Tenderness: There is no abdominal tenderness.  Musculoskeletal:        General: No tenderness.     Cervical back: Normal range of motion and neck supple.  Skin:    General: Skin is warm and dry.     Findings: No erythema or rash.  Neurological:     Mental Status: She is alert and oriented to person, place, and time.     ED Results and Treatments Labs (all labs ordered are listed, but only abnormal results are displayed) Labs Reviewed  URINALYSIS, ROUTINE W REFLEX MICROSCOPIC - Abnormal; Notable for the following components:      Result Value   Specific Gravity, Urine 1.035 (*)    Protein, ur 30 (*)    Bacteria, UA RARE (*)    All other components within normal limits  RESP  PANEL BY RT-PCR (RSV, FLU A&B, COVID)  [id number]  PREGNANCY, URINE                                                                                                                         EKG  EKG Interpretation Date/Time:    Ventricular Rate:    PR Interval:    QRS Duration:    QT Interval:    QTC Calculation:   R Axis:      Text Interpretation:         Radiology No results found.  Medications Ordered in ED Medications  dextromethorphan (DELSYM) 30 MG/5ML liquid 30 mg (has no administration in time range)   Procedures Procedures  (including critical care time) Medical Decision Making / ED Course   Medical Decision Making Amount and/or Complexity of Data Reviewed Labs: ordered.    ***    Final Clinical Impression(s) / ED Diagnoses Final diagnoses:  None    This chart was dictated using voice recognition software.  Despite best efforts to proofread,  errors can occur which can change the documentation meaning.

## 2024-05-29 ENCOUNTER — Other Ambulatory Visit: Payer: Self-pay | Admitting: Nurse Practitioner

## 2024-05-29 ENCOUNTER — Encounter: Payer: Self-pay | Admitting: Nurse Practitioner

## 2024-05-29 DIAGNOSIS — R7401 Elevation of levels of liver transaminase levels: Secondary | ICD-10-CM

## 2024-05-30 ENCOUNTER — Ambulatory Visit
Admission: RE | Admit: 2024-05-30 | Discharge: 2024-05-30 | Disposition: A | Discharge status: Home / Self Care | Source: Ambulatory Visit | Attending: Nurse Practitioner | Admitting: Nurse Practitioner

## 2024-05-30 DIAGNOSIS — R7401 Elevation of levels of liver transaminase levels: Secondary | ICD-10-CM
